# Patient Record
Sex: Female | Born: 1990 | Hispanic: Yes | Marital: Single | Smoking: Never smoker
Health system: Southern US, Community
[De-identification: ages and names within clinical notes are randomized; demographics above are authoritative.]

## PROBLEM LIST (undated history)

## (undated) DIAGNOSIS — Z789 Other specified health status: Secondary | ICD-10-CM

## (undated) HISTORY — PX: NO PAST SURGERIES: SHX2092

---

## 2013-04-29 ENCOUNTER — Emergency Department: Payer: Self-pay | Admitting: Emergency Medicine

## 2013-04-29 LAB — CBC WITH DIFFERENTIAL/PLATELET
Eosinophil #: 0 10*3/uL (ref 0.0–0.7)
HCT: 42.8 % (ref 35.0–47.0)
Lymphocyte #: 1.5 10*3/uL (ref 1.0–3.6)
Monocyte %: 3.1 %
Neutrophil %: 86.9 %
RDW: 12.7 % (ref 11.5–14.5)

## 2013-04-29 LAB — COMPREHENSIVE METABOLIC PANEL
Anion Gap: 4 — ABNORMAL LOW (ref 7–16)
Bilirubin,Total: 0.6 mg/dL (ref 0.2–1.0)
Calcium, Total: 8.9 mg/dL (ref 8.5–10.1)
Chloride: 104 mmol/L (ref 98–107)
EGFR (African American): >60
EGFR (Non-African Amer.): >60
Glucose: 100 mg/dL — ABNORMAL HIGH (ref 65–99)
Osmolality: 275 (ref 275–301)
Potassium: 3.8 mmol/L (ref 3.5–5.1)
SGOT(AST): 38 U/L — ABNORMAL HIGH (ref 15–37)
SGPT (ALT): 53 U/L (ref 12–78)
Sodium: 137 mmol/L (ref 136–145)

## 2013-04-29 LAB — URINALYSIS, COMPLETE
Bacteria: NONE SEEN
Bilirubin,UR: NEGATIVE
Blood: NEGATIVE
Glucose,UR: NEGATIVE mg/dL (ref 0–75)
Ph: 8 (ref 4.5–8.0)
RBC,UR: NONE SEEN /HPF (ref 0–5)
Specific Gravity: 1.023 (ref 1.003–1.030)
Squamous Epithelial: 9
WBC UR: 7 /HPF (ref 0–5)

## 2013-04-29 LAB — WET PREP, GENITAL

## 2013-04-30 LAB — GC/CHLAMYDIA PROBE AMP

## 2013-05-01 LAB — URINE CULTURE

## 2013-07-20 ENCOUNTER — Emergency Department: Payer: Self-pay | Admitting: Emergency Medicine

## 2013-07-20 LAB — CBC
HCT: 38.3 % (ref 35.0–47.0)
HGB: 13.2 g/dL (ref 12.0–16.0)
MCH: 30.9 pg (ref 26.0–34.0)
MCHC: 34.5 g/dL (ref 32.0–36.0)
MCV: 90 fL (ref 80–100)
Platelet: 158 10*3/uL (ref 150–440)
RBC: 4.27 10*6/uL (ref 3.80–5.20)
RDW: 13 % (ref 11.5–14.5)
WBC: 5.8 10*3/uL (ref 3.6–11.0)

## 2013-07-21 LAB — URINALYSIS, COMPLETE
BLOOD: NEGATIVE
Bilirubin,UR: NEGATIVE
Glucose,UR: NEGATIVE mg/dL (ref 0–75)
KETONE: NEGATIVE
Leukocyte Esterase: NEGATIVE
Nitrite: NEGATIVE
Ph: 6 (ref 4.5–8.0)
Protein: NEGATIVE
RBC,UR: <1 /HPF (ref 0–5)
Specific Gravity: 1.023 (ref 1.003–1.030)

## 2013-07-21 LAB — HCG, QUANTITATIVE, PREGNANCY: BETA HCG, QUANT.: 32709 m[IU]/mL — AB

## 2013-07-21 LAB — GC/CHLAMYDIA PROBE AMP

## 2013-07-21 LAB — WET PREP, GENITAL

## 2013-08-06 ENCOUNTER — Emergency Department: Payer: Self-pay | Admitting: Emergency Medicine

## 2013-08-06 LAB — HCG, QUANTITATIVE, PREGNANCY: BETA HCG, QUANT.: 3959 m[IU]/mL — AB

## 2013-08-07 LAB — CBC
HCT: 39.7 % (ref 35.0–47.0)
HGB: 13.5 g/dL (ref 12.0–16.0)
MCH: 30.7 pg (ref 26.0–34.0)
MCHC: 34.1 g/dL (ref 32.0–36.0)
MCV: 90 fL (ref 80–100)
PLATELETS: 189 10*3/uL (ref 150–440)
RBC: 4.41 10*6/uL (ref 3.80–5.20)
RDW: 12.7 % (ref 11.5–14.5)
WBC: 10.7 10*3/uL (ref 3.6–11.0)

## 2014-02-03 ENCOUNTER — Emergency Department: Payer: Self-pay | Admitting: Emergency Medicine

## 2014-02-04 LAB — URINALYSIS, COMPLETE
Bilirubin,UR: NEGATIVE
Blood: NEGATIVE
Glucose,UR: NEGATIVE mg/dL (ref 0–75)
KETONE: NEGATIVE
Leukocyte Esterase: NEGATIVE
Nitrite: NEGATIVE
Ph: 5 (ref 4.5–8.0)
Protein: NEGATIVE
RBC,UR: 1 /HPF (ref 0–5)
Specific Gravity: 1.023 (ref 1.003–1.030)
Squamous Epithelial: 1
WBC UR: 4 /HPF (ref 0–5)

## 2014-02-04 LAB — CBC
HCT: 38.7 % (ref 35.0–47.0)
HGB: 13.3 g/dL (ref 12.0–16.0)
MCH: 31.6 pg (ref 26.0–34.0)
MCHC: 34.5 g/dL (ref 32.0–36.0)
MCV: 92 fL (ref 80–100)
Platelet: 191 10*3/uL (ref 150–440)
RBC: 4.22 10*6/uL (ref 3.80–5.20)
RDW: 13.2 % (ref 11.5–14.5)
WBC: 9.4 10*3/uL (ref 3.6–11.0)

## 2014-02-04 LAB — BASIC METABOLIC PANEL
ANION GAP: 4 — AB (ref 7–16)
BUN: 12 mg/dL (ref 7–18)
Calcium, Total: 8.6 mg/dL (ref 8.5–10.1)
Chloride: 109 mmol/L — ABNORMAL HIGH (ref 98–107)
Co2: 24 mmol/L (ref 21–32)
Creatinine: 0.62 mg/dL (ref 0.60–1.30)
EGFR (Non-African Amer.): >60
Glucose: 80 mg/dL (ref 65–99)
Osmolality: 273 (ref 275–301)
Potassium: 3.2 mmol/L — ABNORMAL LOW (ref 3.5–5.1)
Sodium: 137 mmol/L (ref 136–145)

## 2014-02-04 LAB — TROPONIN I

## 2014-02-04 LAB — HCG, QUANTITATIVE, PREGNANCY: Beta Hcg, Quant.: 60113 m[IU]/mL — ABNORMAL HIGH

## 2014-04-29 ENCOUNTER — Observation Stay: Payer: Self-pay | Admitting: Obstetrics and Gynecology

## 2014-04-29 LAB — URINALYSIS, COMPLETE
BILIRUBIN, UR: NEGATIVE
Blood: NEGATIVE
Glucose,UR: NEGATIVE mg/dL (ref 0–75)
Ketone: NEGATIVE
Leukocyte Esterase: NEGATIVE
Nitrite: POSITIVE
Ph: 8 (ref 4.5–8.0)
Protein: NEGATIVE
SPECIFIC GRAVITY: 1.017 (ref 1.003–1.030)
Squamous Epithelial: 1

## 2014-04-30 LAB — WET PREP, GENITAL

## 2014-05-01 LAB — URINE CULTURE

## 2014-05-21 IMAGING — US US OB < 14 WEEKS - US OB TV
1 series · 14 of 28 positions shown · non-contrast
Comparison: 07/21/2013

CLINICAL DATA: Abdominal pain and vaginal bleeding. Ten weeks
pregnant.

EXAM:
OBSTETRIC <14 WK US AND TRANSVAGINAL OB US
TECHNIQUE: Both transabdominal and transvaginal ultrasound examinations were
performed for complete evaluation of the gestation as well as the
maternal uterus, adnexal regions, and pelvic cul-de-sac.
Transvaginal technique was performed to assess early pregnancy.

[Series 1: us ob < 14 weeks - us ob tv · 0.18mm/px · 14 of 40 slices shown]
[im 2/40]
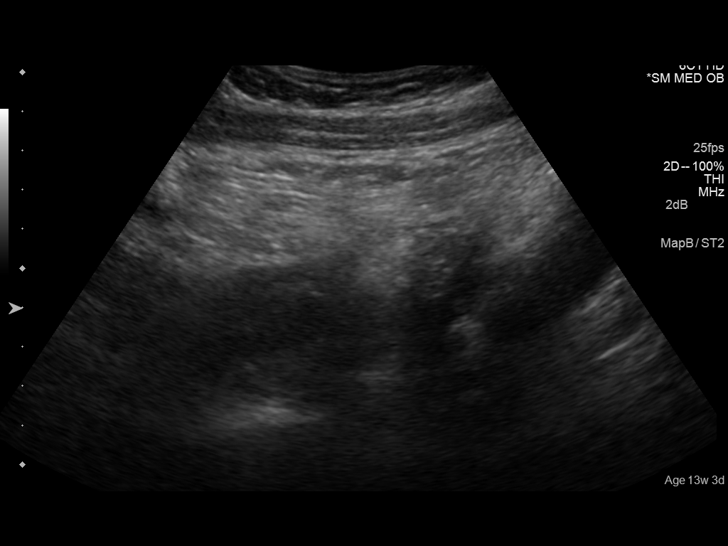
[im 5/40]
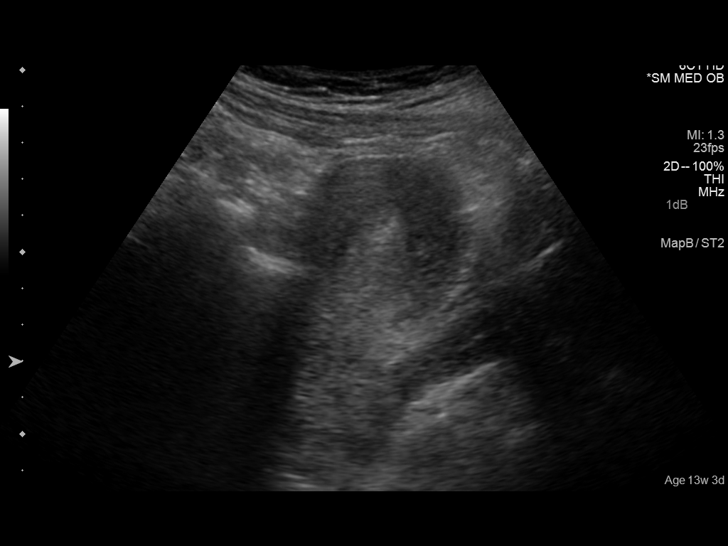
[im 8/40]
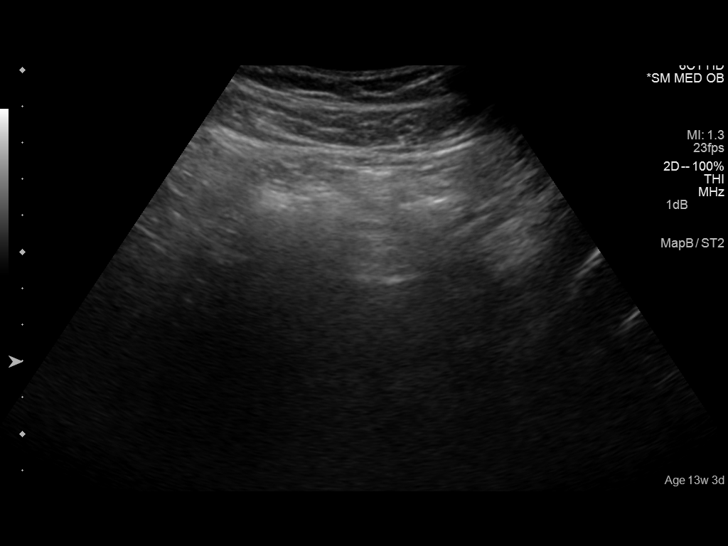
[im 11/40]
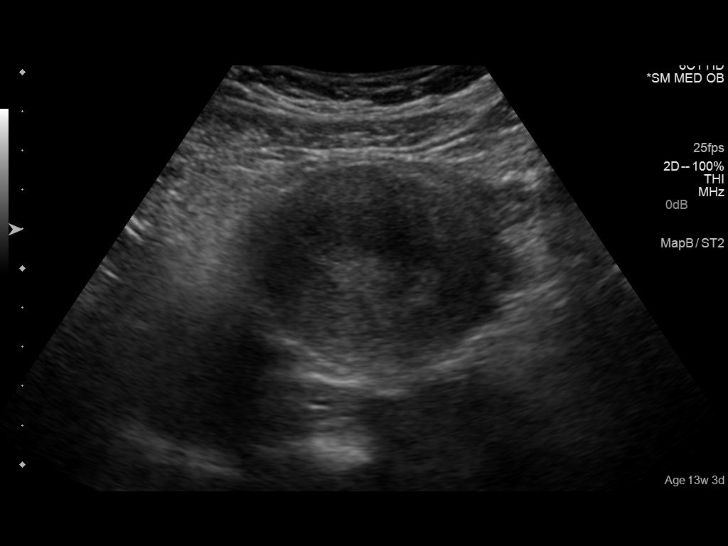
[im 14/40]
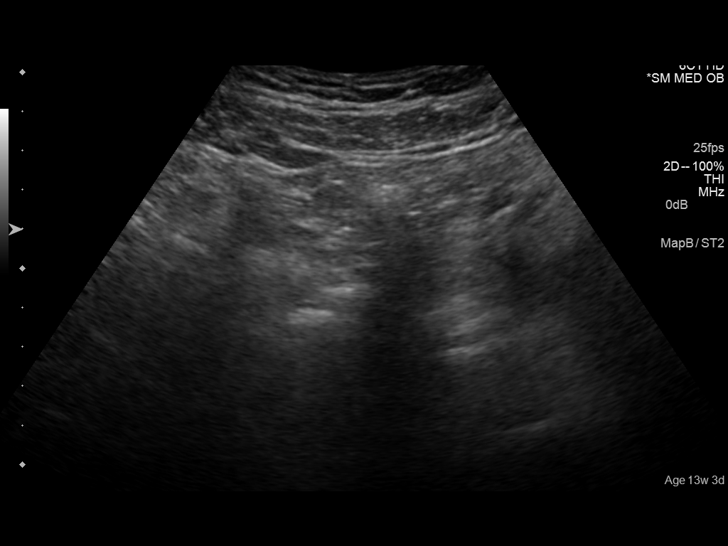
[im 16/40]
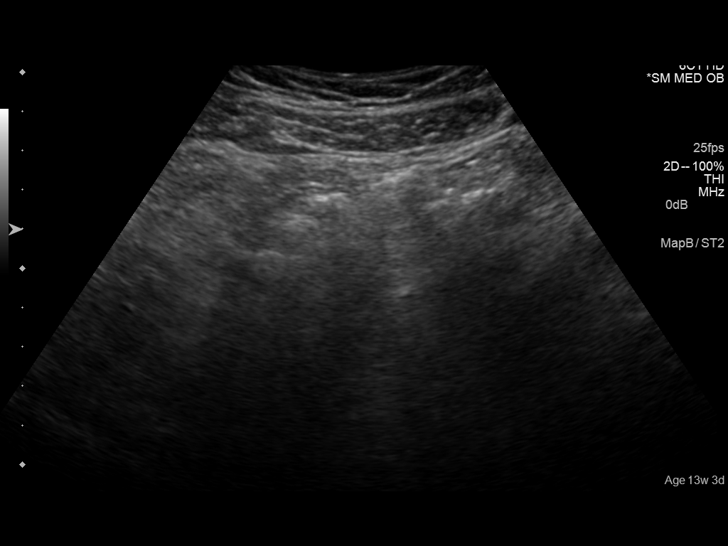
[im 19/40]
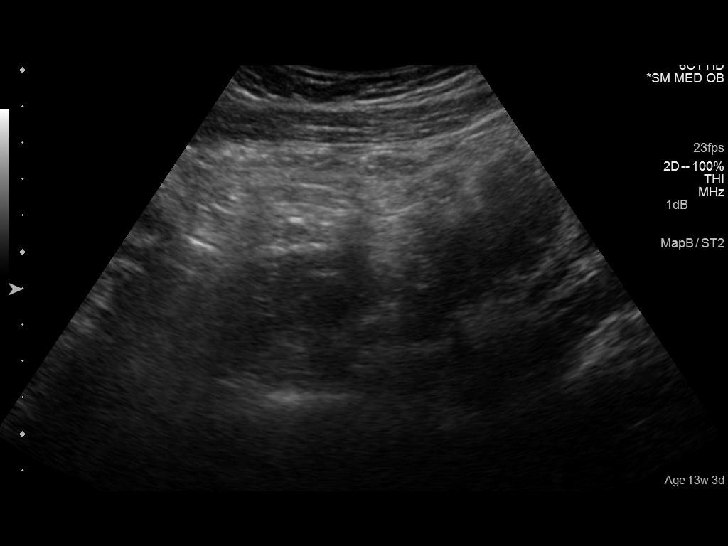
[im 22/40]
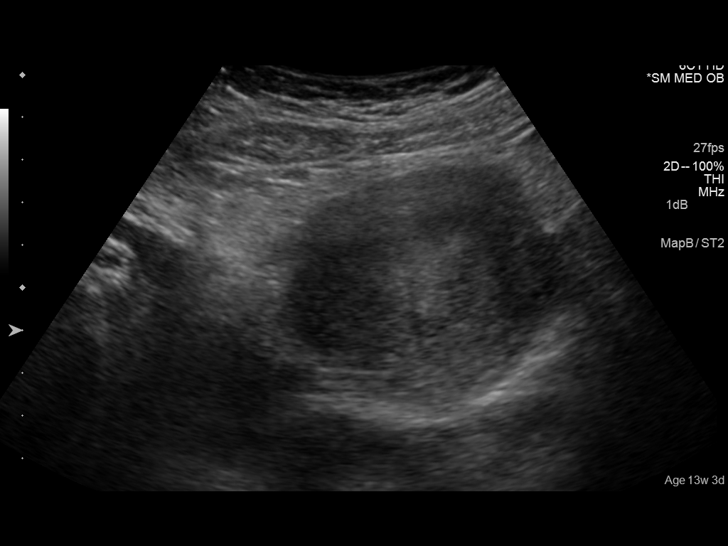
[im 25/40]
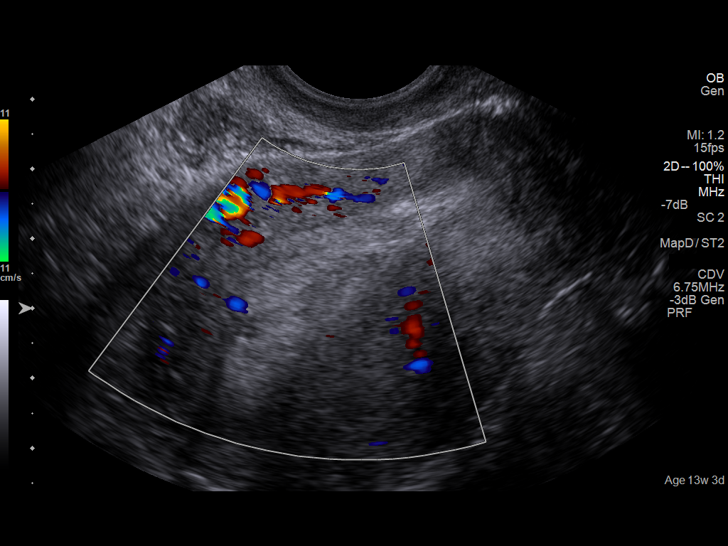
[im 28/40]
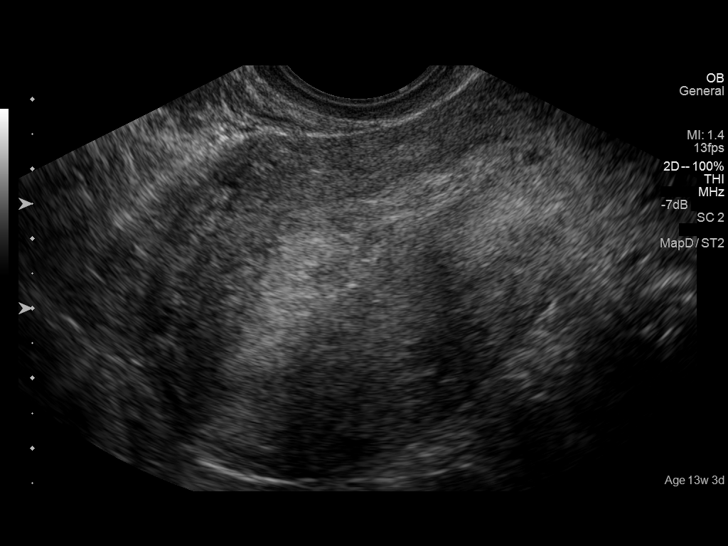
[im 31/40]
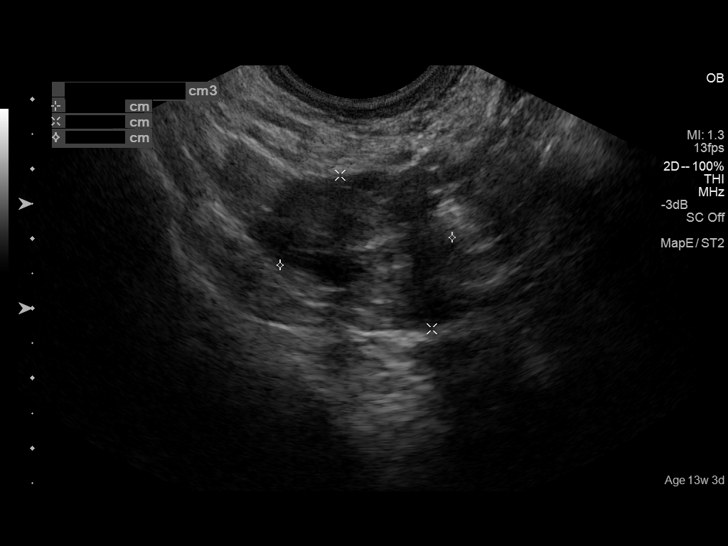
[im 34/40]
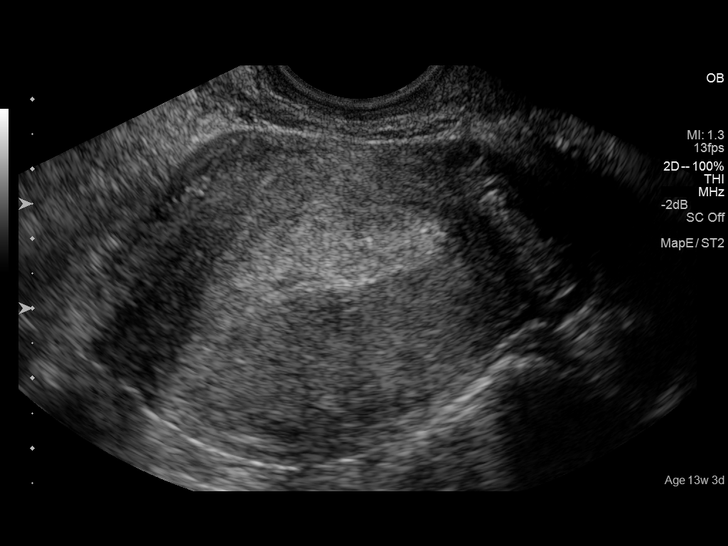
[im 37/40]
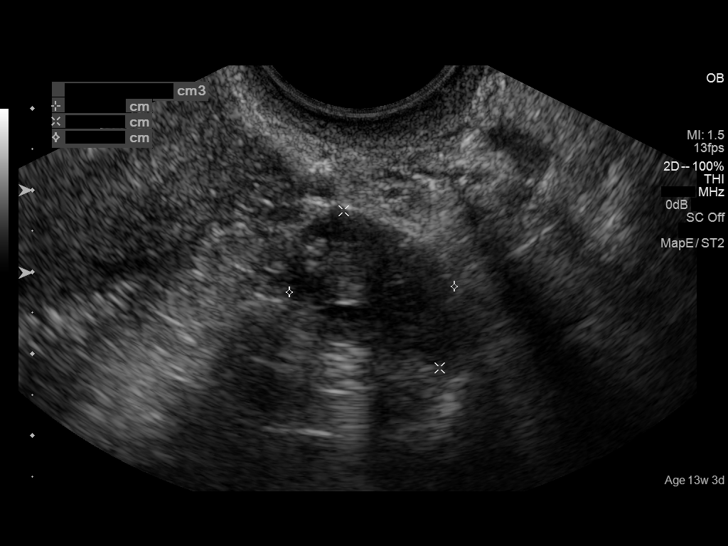
[im 40/40]
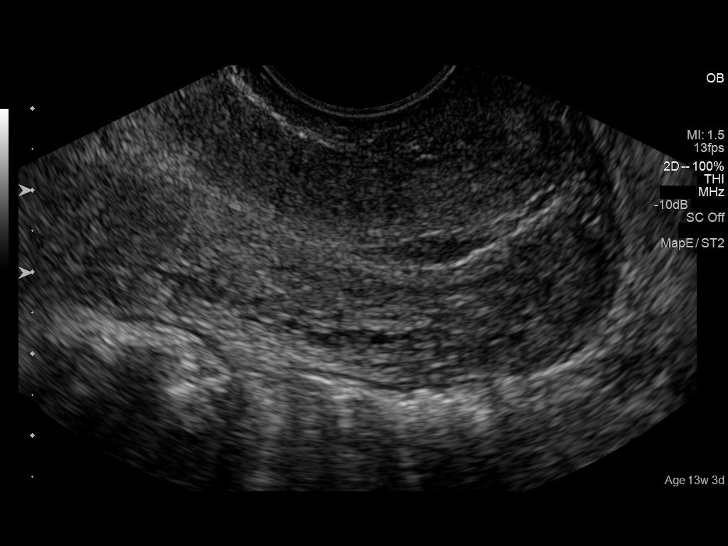

[14 of 28 positions shown; findings below may reference images not displayed]

FINDINGS: The previously seen intrauterine gestational sac has been evacuated
or collapsed. No normal intrauterine pregnancy visualized. In place
of the gestational sac, there is echogenic, mobile material within
the upper endometrial cavity, most consistent with blood products.
No definite vascularized tissue within this echogenic material. The
ovaries are symmetric in size and appearance. No free pelvic fluid.
No adnexal mass.
IMPRESSION: Spontaneous abortion, with the gestational sac noted 07/21/2013 no
longer evident. Moderate volume of blood present within the
endometrial cavity - cannot ensure complete clearance of gestational
products. Recommend serial beta HCG.

## 2014-07-27 ENCOUNTER — Inpatient Hospital Stay
Admit: 2014-07-27 | Disposition: A | Discharge status: Home / Self Care | Payer: Self-pay | Attending: Obstetrics and Gynecology | Admitting: Obstetrics and Gynecology

## 2014-07-27 LAB — CBC WITH DIFFERENTIAL/PLATELET
BASOS ABS: 0 10*3/uL (ref 0.0–0.1)
Basophil %: 0.3 %
EOS ABS: 0 10*3/uL (ref 0.0–0.7)
Eosinophil %: 0.3 %
HCT: 35.6 % (ref 35.0–47.0)
HGB: 11.4 g/dL — ABNORMAL LOW (ref 12.0–16.0)
Lymphocyte #: 2.3 10*3/uL (ref 1.0–3.6)
Lymphocyte %: 20.6 %
MCH: 26.2 pg (ref 26.0–34.0)
MCHC: 32.1 g/dL (ref 32.0–36.0)
MCV: 82 fL (ref 80–100)
MONOS PCT: 5.3 %
Monocyte #: 0.6 x10 3/mm (ref 0.2–0.9)
NEUTROS PCT: 73.5 %
Neutrophil #: 8.1 10*3/uL — ABNORMAL HIGH (ref 1.4–6.5)
Platelet: 206 10*3/uL (ref 150–440)
RBC: 4.37 10*6/uL (ref 3.80–5.20)
RDW: 16 % — AB (ref 11.5–14.5)
WBC: 11 10*3/uL (ref 3.6–11.0)

## 2014-07-28 LAB — HEMATOCRIT: HCT: 31.2 % — AB (ref 35.0–47.0)

## 2014-09-07 NOTE — H&P (Signed)
L&D Evaluation:  History:  HPI g4P1021 with LMP of 10/26/13 & [facility] here for labor starting at 0300am and becoming stronger and painful. PNC significant for   Presents with contractions   Patient's Medical History No Chronic Illness   Medications Pre Natal Vitamins   Allergies NKDA   Social History none   Family History Non-Contributory   ROS:  ROS All systems were reviewed.  HEENT, CNS, GI, GU, Respiratory, CV, Renal and Musculoskeletal systems were found to be normal.   Exam:  Vital Signs stable   General no apparent distress   Mental Status clear   Chest clear   Heart normal sinus rhythm, no murmur/gallop/rubs   Estimated Fetal Weight Average for gestational age   Fetal Position vtx   Reflexes 1+   Clonus negative   Pelvic 3/90/vtx-2   Mebranes Intact   Ucx regular, q 5 mins   Skin dry   Lymph no lymphadenopathy   Impression:  Impression active labor   Plan:  Comments Monitor UC/FHT's. GBS is neg. Will continue VS and pt plans for epidural for pain management.   Electronic Signatures: Sharee PimpleJones, Saysha Menta W (CNM)  (Signed 29-Mar-16 13:28)  Authored: L&D Evaluation   Last Updated: 29-Mar-16 13:28 by Sharee PimpleJones, Tieisha Darden W (CNM)

## 2016-04-30 NOTE — L&D Delivery Note (Signed)
Date of delivery: .11/22/16 Estimated Date of Delivery: 12/10/16 Patient's last menstrual period was 03/19/2016. EGA: 17101w3d  Delivery Note At 4:41 PM a viable female was delivered via Vaginal, Spontaneous Delivery (Presentation: cephalic; LOA).  APGAR: 8, 9; weight 6 lb 4.9 oz (2860 g).   Placenta status: spontaneous, membranes manually extracted.  Cord: 3vv, loos nuchal x1,  with the following complications: none apparent.  Cord pH: not collected  Anesthesia:  epidural Episiotomy: None Lacerations: None Suture Repair: n/a Est. Blood Loss (mL): 250cc  Mom presented to L&D with spontaneous labor.  epidual placed. Progressed to complete, second stage: <3210min, with delivery of fetal head with restitution to LOT.  Nuchal cord reduced at perineum.  Anterior then posterior shoulders delivered without difficulty.  Baby placed on mom's chest, and attended to by peds.  We sang happy birthday to baby [name].  Cord was then clamped and cut by FOB when pulseless.  Placenta spontaneously delivered, with membranes adherent to uterine wall - manually extracted.   IV pitocin given for hemorrhage prophylaxis.    Mom to postpartum.  Baby to Couplet care / Skin to Skin.  Chelsea C Ward 11/22/2016, 4:56 PM

## 2016-08-22 ENCOUNTER — Encounter: Payer: Self-pay | Admitting: *Deleted

## 2016-08-22 ENCOUNTER — Emergency Department
Admission: EM | Admit: 2016-08-22 | Discharge: 2016-08-22 | Disposition: A | Discharge status: Home / Self Care | Payer: Self-pay | Attending: Emergency Medicine | Admitting: Emergency Medicine

## 2016-08-22 DIAGNOSIS — K224 Dyskinesia of esophagus: Secondary | ICD-10-CM | POA: Insufficient documentation

## 2016-08-22 LAB — POCT RAPID STREP A: STREPTOCOCCUS, GROUP A SCREEN (DIRECT): NEGATIVE

## 2016-08-22 MED ORDER — FAMOTIDINE 20 MG PO TABS
40.0000 mg | ORAL_TABLET | Freq: Once | ORAL | Status: AC
  Administered 2016-08-22: 40 mg via ORAL
  Filled 2016-08-22: qty 2

## 2016-08-22 MED ORDER — METOCLOPRAMIDE HCL 5 MG PO TABS: 5.0000 mg | ORAL_TABLET | Freq: Three times a day (TID) | ORAL | 0 refills | Status: DC | PRN

## 2016-08-22 MED ORDER — METOCLOPRAMIDE HCL 10 MG PO TABS
20.0000 mg | ORAL_TABLET | Freq: Once | ORAL | Status: AC
  Administered 2016-08-22: 20 mg via ORAL
  Filled 2016-08-22: qty 2

## 2016-08-22 NOTE — ED Notes (Signed)
Not in room for discharge instructions   

## 2016-08-22 NOTE — ED Notes (Signed)
See triage note  States she felt like something is stuck in throat since last pm became worse this am  Feels like she can't swallow food or water  No drooling noted

## 2016-08-22 NOTE — ED Triage Notes (Signed)
Pt complains of a sore throat, pt denies any other symptoms, pt is [redacted] weeks pregnant, denies any problems with pregnancy

## 2016-08-22 NOTE — Discharge Instructions (Signed)
Your symptoms are consistent with spasms of the swallowing tube (esophagus). Take the prescription medicine as directed. Follow-up with [facility] for ongoing symptoms. Eat soft foods and drink plenty of fluids to prevent dehydration. Return to the ED for vomiting, gagging, choking, or chest pains.

## 2016-08-25 LAB — OB RESULTS CONSOLE GBS: STREP GROUP B AG: NEGATIVE

## 2016-08-30 NOTE — ED Provider Notes (Signed)
[facility] Emergency Department Provider Note ____________________________________________  Time seen: 1220  I have reviewed the triage vital signs and the nursing notes.  HISTORY  Chief Complaint  Sore Throat  HPI Carrie Wagner is a 26 y.o. female presents to the ED at [redacted] weeks gestation, for evaluation of a sore throat. Upon further questioning, she describes a globus sensation to the throat. She reports avoiding food and drink due to the sense that she has pressure in her throat. She denies cough, choking, gagging, or vomiting. She also denies any swelling to her mouth, lips, or tongue. She denies any recent illness, fevers, or history of reflux except with her last pregnancy.   History reviewed. No pertinent past medical history.  There are no active problems to display for this patient.  History reviewed. No pertinent surgical history.  Prior to Admission medications   Medication Sig Start Date End Date Taking? Authorizing Provider  metoCLOPramide (REGLAN) 5 MG tablet Take 1 tablet (5 mg total) by mouth every 8 (eight) hours as needed for nausea or vomiting. 08/22/16   Charlesetta Ivory Jaquan Sadowsky, PA-C    Allergies Patient has no known allergies.  No family history on file.  Social History Social History  Substance Use Topics  . Smoking status: Never Smoker  . Smokeless tobacco: Not on file  . Alcohol use No    Review of Systems  Constitutional: Negative for fever. Eyes: Negative for visual changes. ENT: Negative for sore throat. Globus sensation as above Cardiovascular: Negative for chest pain. Respiratory: Negative for shortness of breath. Gastrointestinal: Negative for abdominal pain, vomiting and diarrhea. ____________________________________________  PHYSICAL EXAM:  VITAL SIGNS: ED Triage Vitals  Enc Vitals Group     BP 08/22/16 1140 (!) 102/56     Pulse Rate 08/22/16 1140 81     Resp 08/22/16 1140 14     Temp 08/22/16  1140 99 F (37.2 C)     Temp Source 08/22/16 1140 Oral     SpO2 08/22/16 1140 99 %     Weight 08/22/16 1129 156 lb (70.8 kg)     Height 08/22/16 1129 5\' 4"  (1.626 m)     Head Circumference --      Peak Flow --      Pain Score --      Pain Loc --      Pain Edu? --      Excl. in GC? --     Constitutional: Alert and oriented. Well appearing and in no distress. Head: Normocephalic and atraumatic. Eyes: Conjunctivae are normal. PERRL. Normal extraocular movements Mouth/Throat: Mucous membranes are moist. Uvula is midline and tonsils are flat. No oropharyngeal lesions noted. No saliva pooling or sublingual swelling. No dental abscess appreciated.  Neck: Supple. No thyromegaly. Hematological/Lymphatic/Immunological: No cervical lymphadenopathy. Cardiovascular: Normal rate, regular rhythm. Normal distal pulses. Respiratory: Normal respiratory effort. No wheezes/rales/rhonchi. Gastrointestinal: Soft and nontender. No distention. Musculoskeletal: Nontender with normal range of motion in all extremities.  Neurologic:  Normal gait without ataxia. Normal speech and language. No gross focal neurologic deficits are appreciated. Skin:  Skin is warm, dry and intact. No rash noted. Psychiatric: Mood and affect are normal. Patient exhibits appropriate insight and judgment. ____________________________________________   LABS (pertinent positives/negatives) Labs Reviewed  POCT RAPID STREP A  ____________________________________________  PROCEDURES  Famotidine 40 mg PO Metoclopramide 20 mg PO ______________________________________________  INITIAL IMPRESSION / ASSESSMENT AND PLAN / ED COURSE  Pregnant female patient with an ED evaluation for globus sensation to  the throat. The patient is with an otherwise benign exam and a negative rapid strep test. She will be treated at this time with a prescription for metoclopramide, for smooth Muscle spasm and esophageal spasm. She is encouraged to follow  with her OB provider for ongoing symptom management. She may infected evaluated by a gastroenterologist, in the postpartum setting. Return precautions were reviewed for difficulty swallowing, difficulty breathing, or intractable pain. ____________________________________________  FINAL CLINICAL IMPRESSION(S) / ED DIAGNOSES  Final diagnoses:  Esophagospasm      Lissa HoardJenise V Bacon Ambrosia Wisnewski, PA-C 08/30/16 1828    Emily FilbertWilliams, Jonathan E, MD 09/03/16 [phone]

## 2016-10-23 LAB — OB RESULTS CONSOLE RUBELLA ANTIBODY, IGM: Rubella: IMMUNE

## 2016-10-23 LAB — OB RESULTS CONSOLE GC/CHLAMYDIA
Chlamydia: NEGATIVE
GC PROBE AMP, GENITAL: NEGATIVE

## 2016-10-23 LAB — OB RESULTS CONSOLE HIV ANTIBODY (ROUTINE TESTING): HIV: NONREACTIVE

## 2016-10-23 LAB — OB RESULTS CONSOLE RPR: RPR: NONREACTIVE

## 2016-10-23 LAB — OB RESULTS CONSOLE VARICELLA ZOSTER ANTIBODY, IGG: Varicella: NON-IMMUNE/NOT IMMUNE

## 2016-10-23 LAB — OB RESULTS CONSOLE HEPATITIS B SURFACE ANTIGEN: Hepatitis B Surface Ag: NEGATIVE

## 2016-11-22 ENCOUNTER — Inpatient Hospital Stay
Admission: EM | Admit: 2016-11-22 | Discharge: 2016-11-24 | DRG: 775 | Disposition: A | Discharge status: Home / Self Care | Payer: Self-pay | Attending: Obstetrics & Gynecology | Admitting: Obstetrics & Gynecology

## 2016-11-22 ENCOUNTER — Inpatient Hospital Stay: Payer: Self-pay | Admitting: Registered Nurse

## 2016-11-22 DIAGNOSIS — Z3A37 37 weeks gestation of pregnancy: Secondary | ICD-10-CM

## 2016-11-22 HISTORY — DX: Other specified health status: Z78.9

## 2016-11-22 LAB — CBC
HCT: 35.4 % (ref 35.0–47.0)
HEMOGLOBIN: 11.7 g/dL — AB (ref 12.0–16.0)
MCH: 27 pg (ref 26.0–34.0)
MCHC: 33 g/dL (ref 32.0–36.0)
MCV: 81.7 fL (ref 80.0–100.0)
Platelets: 195 10*3/uL (ref 150–440)
RBC: 4.33 MIL/uL (ref 3.80–5.20)
RDW: 15 % — ABNORMAL HIGH (ref 11.5–14.5)
WBC: 11.5 10*3/uL — AB (ref 3.6–11.0)

## 2016-11-22 LAB — TYPE AND SCREEN
ABO/RH(D): O POS
Antibody Screen: NEGATIVE

## 2016-11-22 MED ORDER — ONDANSETRON HCL 4 MG PO TABS: 4.0000 mg | ORAL_TABLET | ORAL | Status: DC | PRN

## 2016-11-22 MED ORDER — LIDOCAINE HCL (PF) 1 % IJ SOLN
30.0000 mL | INTRAMUSCULAR | Status: DC | PRN
  Filled 2016-11-22: qty 30

## 2016-11-22 MED ORDER — IBUPROFEN 600 MG PO TABS
600.0000 mg | ORAL_TABLET | Freq: Four times a day (QID) | ORAL | Status: DC
  Administered 2016-11-22 – 2016-11-24 (×6): 600 mg via ORAL
  Filled 2016-11-22 (×6): qty 1

## 2016-11-22 MED ORDER — LACTATED RINGERS IV SOLN
500.0000 mL | INTRAVENOUS | Status: DC | PRN
  Administered 2016-11-22: 500 mL via INTRAVENOUS

## 2016-11-22 MED ORDER — ACETAMINOPHEN 500 MG PO TABS: 1000.0000 mg | ORAL_TABLET | Freq: Four times a day (QID) | ORAL | Status: DC | PRN

## 2016-11-22 MED ORDER — SOD CITRATE-CITRIC ACID 500-334 MG/5ML PO SOLN: 30.0000 mL | ORAL | Status: DC | PRN

## 2016-11-22 MED ORDER — COCONUT OIL OIL: 1.0000 "application " | TOPICAL_OIL | Status: DC | PRN

## 2016-11-22 MED ORDER — WITCH HAZEL-GLYCERIN EX PADS: 1.0000 "application " | MEDICATED_PAD | CUTANEOUS | Status: DC | PRN

## 2016-11-22 MED ORDER — PHENYLEPHRINE 40 MCG/ML (10ML) SYRINGE FOR IV PUSH (FOR BLOOD PRESSURE SUPPORT)
PREFILLED_SYRINGE | INTRAVENOUS | Status: AC
  Filled 2016-11-22: qty 10

## 2016-11-22 MED ORDER — DIPHENHYDRAMINE HCL 25 MG PO CAPS: 25.0000 mg | ORAL_CAPSULE | Freq: Four times a day (QID) | ORAL | Status: DC | PRN

## 2016-11-22 MED ORDER — SODIUM CHLORIDE 0.9 % IV SOLN
2.0000 g | Freq: Once | INTRAVENOUS | Status: AC
  Administered 2016-11-22: 2 g via INTRAVENOUS
  Filled 2016-11-22: qty 2000

## 2016-11-22 MED ORDER — BENZOCAINE-MENTHOL 20-0.5 % EX AERO: 1.0000 "application " | INHALATION_SPRAY | CUTANEOUS | Status: DC | PRN

## 2016-11-22 MED ORDER — BUPIVACAINE HCL (PF) 0.25 % IJ SOLN
INTRAMUSCULAR | Status: DC | PRN
  Administered 2016-11-22 (×2): 4 mL via EPIDURAL

## 2016-11-22 MED ORDER — OXYTOCIN 40 UNITS IN LACTATED RINGERS INFUSION - SIMPLE MED
2.5000 [IU]/h | INTRAVENOUS | Status: DC
  Filled 2016-11-22: qty 1000

## 2016-11-22 MED ORDER — DIBUCAINE 1 % RE OINT: 1.0000 "application " | TOPICAL_OINTMENT | RECTAL | Status: DC | PRN

## 2016-11-22 MED ORDER — SIMETHICONE 80 MG PO CHEW: 80.0000 mg | CHEWABLE_TABLET | ORAL | Status: DC | PRN

## 2016-11-22 MED ORDER — FENTANYL 2.5 MCG/ML W/ROPIVACAINE 0.15% IN NS 100 ML EPIDURAL (ARMC)
EPIDURAL | Status: AC
  Filled 2016-11-22: qty 100

## 2016-11-22 MED ORDER — FENTANYL 2.5 MCG/ML W/ROPIVACAINE 0.15% IN NS 100 ML EPIDURAL (ARMC)
EPIDURAL | Status: DC | PRN
  Administered 2016-11-22: 12 mL/h via EPIDURAL

## 2016-11-22 MED ORDER — OXYTOCIN BOLUS FROM INFUSION
500.0000 mL | Freq: Once | INTRAVENOUS | Status: AC
  Administered 2016-11-22: 500 mL via INTRAVENOUS

## 2016-11-22 MED ORDER — ACETAMINOPHEN 325 MG PO TABS: 650.0000 mg | ORAL_TABLET | ORAL | Status: DC | PRN

## 2016-11-22 MED ORDER — LIDOCAINE-EPINEPHRINE (PF) 1.5 %-1:200000 IJ SOLN
INTRAMUSCULAR | Status: DC | PRN
  Administered 2016-11-22: 3 mL via PERINEURAL

## 2016-11-22 MED ORDER — ONDANSETRON HCL 4 MG/2ML IJ SOLN: 4.0000 mg | INTRAMUSCULAR | Status: DC | PRN

## 2016-11-22 MED ORDER — AMMONIA AROMATIC IN INHA
RESPIRATORY_TRACT | Status: AC
  Filled 2016-11-22: qty 10

## 2016-11-22 MED ORDER — DOCUSATE SODIUM 100 MG PO CAPS
100.0000 mg | ORAL_CAPSULE | Freq: Two times a day (BID) | ORAL | Status: DC
  Administered 2016-11-23 (×3): 100 mg via ORAL
  Filled 2016-11-22 (×3): qty 1

## 2016-11-22 MED ORDER — ONDANSETRON HCL 4 MG/2ML IJ SOLN: 4.0000 mg | Freq: Four times a day (QID) | INTRAMUSCULAR | Status: DC | PRN

## 2016-11-22 MED ORDER — MISOPROSTOL 200 MCG PO TABS
ORAL_TABLET | ORAL | Status: AC
  Filled 2016-11-22: qty 4

## 2016-11-22 MED ORDER — LACTATED RINGERS IV SOLN
INTRAVENOUS | Status: DC
  Administered 2016-11-22: 13:00:00 via INTRAVENOUS

## 2016-11-22 MED ORDER — PRENATAL MULTIVITAMIN CH
1.0000 | ORAL_TABLET | Freq: Every day | ORAL | Status: DC
Start: 2016-11-23 — End: 2016-11-24
  Administered 2016-11-23: 1 via ORAL
  Filled 2016-11-22: qty 1

## 2016-11-22 MED ORDER — LIDOCAINE HCL (PF) 1 % IJ SOLN
INTRAMUSCULAR | Status: DC | PRN
  Administered 2016-11-22: 3 mL

## 2016-11-22 NOTE — Progress Notes (Signed)
Intrapartum progress note  S/p epidural, comfortable. No complaints  BP 103/71   Pulse 61   Temp 98.6 F (37 C) (Oral)   Resp 18   Ht 5\' 4"  (1.626 m)   Wt 73.9 kg (163 lb)   LMP 03/25/2016 (LMP Unknown)   SpO2 99%   BMI 27.98 kg/m   FHT: 135 mod + accels no decels TOCO q762min SVE: 9/100/0, AROM for clear fluid  AP: 25yo [id number] @ 37.3 with labor 1. IUP: category 1 2. Expectant management after AROM  ----- Carrie Plumberhelsea Derrius Furtick, MD Attending Obstetrician and Gynecologist [facility], Department of OB/GYN [facility]

## 2016-11-22 NOTE — OB Triage Note (Signed)
Pt  Presents form ED c/o ctx every 7-8 min with pain 8/10. Pt denies LOF, VB and states +FM.

## 2016-11-22 NOTE — Discharge Summary (Signed)
Obstetrical Discharge Summary  Patient Name: Carrie FerrierMaria Morales Wagner DOB: [birth year] MRN: [id number]  Date of Admission: 11/22/2016 Date of Delivery: 11/22/16 Delivered by: Ranae Plumberhelsea Ward, MD Date of Discharge: 11/22/2016  Primary OB: [facility]  JYN:[id number]:Patient's last menstrual period was 03/19/2016. EDC Estimated Date of Delivery: 12/10/16 Gestational Age at Delivery: 60105w3d   Antepartum complications: Insufficient prenatal care  Admitting Diagnosis: labor Secondary Diagnosis: Patient Active Problem List   Diagnosis Date Noted  . Labor and delivery, indication for care 11/22/2016    Augmentation: AROM Complications: None Intrapartum complications/course: Mom presented to L&D with spontaneous labor.  epidual placed. Progressed to complete, second stage: <4810min, with delivery of fetal head with restitution to LOT.  Nuchal cord reduced at perineum.  Anterior then posterior shoulders delivered without difficulty.  Baby placed on mom's chest, and attended to by peds.  We sang happy birthday to baby [name].  Cord was then clamped and cut by FOB when pulseless.  Placenta spontaneously delivered, with membranes adherent to uterine wall - manually extracted.   IV pitocin given for hemorrhage prophylaxis. Date of Delivery: 11/22/16 Delivered By: Leeroy Bockhelsea Ward Delivery Type: spontaneous vaginal delivery Anesthesia: epidural Placenta: sponatneous Laceration: none Episiotomy: none Newborn Data: Live born female, [name] Birth Weight: 6 lb 4.9 oz (2860 g) APGAR: 8, 9  Postpartum Procedures: None  Post partum course: sTable  Patient had an uncomplicated postpartum course.  By time of discharge on PPD#1 her pain was controlled on oral pain medications; she had appropriate lochia and was ambulating, voiding without difficulty and tolerating regular diet.  She was deemed stable for discharge to home.     Discharge Physical Exam:  BP 103/71   Pulse 61   Temp 98.6 F (37 C) (Oral)   Resp 18   Ht  5\' 4"  (1.626 m)   Wt 73.9 kg (163 lb)   LMP 03/19/2016   SpO2 99%   BMI 27.98 kg/m   General: NAD CV: RRR Pulm: CTABL, nl effort ABD: s/nd/nt, fundus firm and below the umbilicus Lochia: moderate DVT Evaluation: LE non-ttp, no evidence of DVT on exam.  Hemoglobin  Date Value Ref Range Status  11/22/2016 11.7 (L) 12.0 - 16.0 g/dL Final   HGB  Date Value Ref Range Status  07/27/2014 11.4 (L) 12.0 - 16.0 g/dL Final   HCT  Date Value Ref Range Status  11/22/2016 35.4 35.0 - 47.0 % Final  07/28/2014 31.2 (L) 35.0 - 47.0 % Final     Disposition: stable, discharge to home. Baby Feeding: breastmilk/formula Baby Disposition: home with mom  Rh Immune globulin given: n/a Rubella vaccine given: n/a Tdap vaccine given in AP or PP setting: AP Flu vaccine given in AP or PP setting: n/a  Contraception: plans nexplanon  Prenatal Labs:  Antibody screen neg  Rubella Immune  Varicella Immune - had dz  RPR NR  HBsAg Neg  HIV NR  GC neg  Chlamydia neg  Genetic screening Not done  1 hour GTT 139  3 hour GTT   GBS Neg 12 weeks ago     Plan:  Carrie FerrierMaria Morales Wagner was discharged to home in good condition. Follow-up appointment with delivering provider in 6 weeks.  Discharge Medications: PNV, Ibuprofen, FE    Signed: Sharee Pimplearon W. Benito Lemmerman, RN, MSN, CNM, FNP

## 2016-11-22 NOTE — Anesthesia Procedure Notes (Signed)
Epidural Patient location during procedure: OB Start time: 11/22/2016 1:56 PM End time: 11/22/2016 1:58 PM  Staffing Anesthesiologist: Yevette EdwardsADAMS, JAMES G Resident/CRNA: Malva CoganBEANE, CATHERINE  Preanesthetic Checklist Completed: patient identified, site marked, surgical consent, pre-op evaluation, timeout performed, IV checked, risks and benefits discussed and monitors and equipment checked  Epidural Patient position: sitting Prep: Betadine Patient monitoring: heart rate, continuous pulse ox and blood pressure Approach: midline Location: L4-L5 Injection technique: LOR saline  Needle:  Needle type: Tuohy  Needle gauge: 17 G Needle length: 9 cm and 9 Needle insertion depth: 4 cm Catheter type: closed end flexible Catheter size: 19 Gauge Catheter at skin depth: 9 cm Test dose: negative and 1.5% lidocaine with Epi 1:200 K  Assessment Sensory level: T10 Events: blood not aspirated, injection not painful, no injection resistance, negative IV test and no paresthesia  Additional Notes Pt. Evaluated and documentation done after procedure finished. Patient identified. Risks/Benefits/Options discussed with patient including but not limited to bleeding, infection, nerve damage, paralysis, failed block, incomplete pain control, headache, blood pressure changes, nausea, vomiting, reactions to medication both or allergic, itching and postpartum back pain. Confirmed with bedside nurse the patient's most recent platelet count. Confirmed with patient that they are not currently taking any anticoagulation, have any bleeding history or any family history of bleeding disorders. Patient expressed understanding and wished to proceed. All questions were answered. Sterile technique was used throughout the entire procedure. Please see nursing notes for vital signs. Test dose was given through epidural catheter and negative prior to continuing to dose epidural or start infusion. Warning signs of high block given to the  patient including shortness of breath, tingling/numbness in hands, complete motor block, or any concerning symptoms with instructions to call for help. Patient was given instructions on fall risk and not to get out of bed. All questions and concerns addressed with instructions to call with any issues or inadequate analgesia.   Patient tolerated the insertion well without immediate complications.Reason for block:procedure for pain

## 2016-11-22 NOTE — H&P (Signed)
OB History & Physical   History of Present Illness:  Chief Complaint: ctx HPI:  Carrie Wagner is a 26 y.o. [phone] female at 344w3d dated by 32wk US with Estimated Date of Delivery: 12/24/16 LMP reported as 03/25/16 (approx) She presents to L&D with contractions that started last night  +FM, + CTX, no LOF, no VB  Pregnancy Issues: 1. scant prenatal care 2. Unsure dating  Maternal Medical History:   Past Medical History:  Diagnosis Date  . Medical history non-contributory     Past Surgical History:  Procedure Laterality Date  . NO PAST SURGERIES      No Known Allergies  Prior to Admission medications   Medication Sig Start Date End Date Taking? Authorizing Provider  Prenatal Vit-Fe Fumarate-FA (MULTIVITAMIN-PRENATAL) 27-0.8 MG TABS tablet Take 1 tablet by mouth daily at 12 noon.   Yes [provider]  metoCLOPramide (REGLAN) 5 MG tablet Take 1 tablet (5 mg total) by mouth every 8 (eight) hours as needed for nausea or vomiting. Patient not taking: Reported on 11/22/2016 08/22/16   Menshew, Charlesetta IvoryJenise V Bacon, PA-C     [facility]  Social History: She  reports that she has never smoked. She has never used smokeless tobacco. She reports that she does not drink alcohol or use drugs.  Family History: family history is not on file.   Review of Systems: A full review of systems was performed and negative except as noted in the HPI.     Physical Exam:  Vital Signs: BP 109/71 (BP Location: Right Arm)   Pulse 71   Temp 98.6 F (37 C) (Oral)   Resp 18   Ht 5\' 4"  (1.626 m)   Wt 73.9 kg (163 lb)   LMP 03/25/2016 (LMP Unknown)   BMI 27.98 kg/m  General: no acute distress.  HEENT: normocephalic, atraumatic Heart: regular rate & rhythm.  No murmurs/rubs/gallops Lungs: clear to auscultation bilaterally, normal respiratory effort Abdomen: soft, gravid, non-tender;  EFW: 6.6 Pelvic:   External: Normal external female genitalia  Cervix:  Dilation: 6 / Effacement (%): 90 / Station: -1    Extremities: non-tender, symmetric, mild edema bilaterally.  DTRs: 2+  Neurologic: Alert & oriented x 3.    Results for orders placed or performed during the hospital encounter of 11/22/16 (from the past 24 hour(s))  CBC     Status: Abnormal   Collection Time: 11/22/16 12:32 PM  Result Value Ref Range   WBC 11.5 (H) 3.6 - 11.0 K/uL   RBC 4.33 3.80 - 5.20 MIL/uL   Hemoglobin 11.7 (L) 12.0 - 16.0 g/dL   HCT 69.635.4 29.535.0 - 28.447.0 %   MCV 81.7 80.0 - 100.0 fL   MCH 27.0 26.0 - 34.0 pg   MCHC 33.0 32.0 - 36.0 g/dL   RDW 13.215.0 (H) 44.011.5 - 10.214.5 %   Platelets 195 150 - 440 K/uL  Type and screen [facility]     Status: None (Preliminary result)   Collection Time: 11/22/16 12:32 PM  Result Value Ref Range   ABO/RH(D) PENDING    Antibody Screen PENDING    Sample Expiration 11/25/2016     Pertinent Results:  Prenatal Labs: Blood type/Rh O+  Antibody screen neg  Rubella Immune  Varicella Immune - had dz  RPR NR  HBsAg Neg  HIV NR  GC neg  Chlamydia neg  Genetic screening Not done  1 hour GTT 139  3 hour GTT   GBS Neg 12  weeks ago  tdap given   FHT: 145 mod +accels no decels TOCO: q4-155min SVE:  Dilation: 6 / Effacement (%): 90 / Station: -1    Cephalic by leopolds   Assessment:  Carrie Wagner is a 26 y.o. [phone] female at 2920w3d with labor.   Plan:  1. Admit to Labor & Delivery 2. CBC, T&S, Clrs, IVF 3. GBS  Unknown, no risk factors. 4. Consents obtained. 5. Continuous efm/toco 6. Expectant management 7. Epidural if desired.  ----- Ranae Plumberhelsea Ward, MD Attending Obstetrician and Gynecologist [facility], Department of OB/GYN [facility]

## 2016-11-22 NOTE — Anesthesia Preprocedure Evaluation (Signed)
Anesthesia Evaluation  Patient identified by MRN, date of birth, ID band Patient awake    Reviewed: Allergy & Precautions, H&P , NPO status , Patient's Chart, lab work & pertinent test results  History of Anesthesia Complications Negative for: history of anesthetic complications  Airway Mallampati: II  TM Distance: >3 FB Neck ROM: full    Dental no notable dental hx.    Pulmonary neg pulmonary ROS,    Pulmonary exam normal        Cardiovascular negative cardio ROS Normal cardiovascular exam     Neuro/Psych negative neurological ROS  negative psych ROS   GI/Hepatic Neg liver ROS,   Endo/Other  negative endocrine ROS  Renal/GU negative Renal ROS  negative genitourinary   Musculoskeletal   Abdominal   Peds  Hematology negative hematology ROS (+)   Anesthesia Other Findings   Reproductive/Obstetrics (+) Pregnancy                             Anesthesia Physical Anesthesia Plan  ASA: II  Anesthesia Plan: Epidural   Post-op Pain Management:    Induction:   PONV Risk Score and Plan:   Airway Management Planned:   Additional Equipment:   Intra-op Plan:   Post-operative Plan:   Informed Consent: I have reviewed the patients History and Physical, chart, labs and discussed the procedure including the risks, benefits and alternatives for the proposed anesthesia with the patient or authorized representative who has indicated his/her understanding and acceptance.     Plan Discussed with: Anesthesiologist  Anesthesia Plan Comments:         Anesthesia Quick Evaluation

## 2016-11-22 NOTE — Discharge Instructions (Signed)

## 2016-11-22 NOTE — OB Triage Note (Signed)
Pt states ctx started last night

## 2016-11-23 LAB — CBC
HCT: 32.9 % — ABNORMAL LOW (ref 35.0–47.0)
HEMOGLOBIN: 11 g/dL — AB (ref 12.0–16.0)
MCH: 26.9 pg (ref 26.0–34.0)
MCHC: 33.4 g/dL (ref 32.0–36.0)
MCV: 80.5 fL (ref 80.0–100.0)
Platelets: 167 10*3/uL (ref 150–440)
RBC: 4.08 MIL/uL (ref 3.80–5.20)
RDW: 15.5 % — ABNORMAL HIGH (ref 11.5–14.5)
WBC: 13.5 10*3/uL — AB (ref 3.6–11.0)

## 2016-11-23 LAB — RPR: RPR Ser Ql: NONREACTIVE

## 2016-11-23 NOTE — Anesthesia Postprocedure Evaluation (Signed)
Anesthesia Post Note  Patient: [name]  Procedure(s) Performed: * No procedures listed *  Patient location during evaluation: Mother Baby Anesthesia Type: Epidural Level of consciousness: awake, awake and alert and oriented Pain management: pain level controlled Vital Signs Assessment: post-procedure vital signs reviewed and stable Respiratory status: spontaneous breathing, nonlabored ventilation and respiratory function stable Cardiovascular status: stable Postop Assessment: no headache, no backache, patient able to bend at knees, no signs of nausea or vomiting and adequate PO intake Anesthetic complications: no     Last Vitals:  Vitals:   11/23/16 0553 11/23/16 0712  BP: 96/63 104/68  Pulse: (!) 58 62  Resp: 16 18  Temp: 36.8 C 36.9 C    Last Pain:  Vitals:   11/23/16 0712  TempSrc: Oral  PainSc:                  Lyn RecordsNoles,  Yordy Matton R

## 2016-11-23 NOTE — Progress Notes (Signed)
Post Partum Day 1 Subjective: Wants to go home today  Objective: Blood pressure 104/68, pulse 62, temperature 98.5 F (36.9 C), temperature source Oral, resp. rate 18, height 5\' 4"  (1.626 m), weight 73.9 kg (163 lb), last menstrual period 03/19/2016, SpO2 97 %, unknown if currently breastfeeding.  Physical Exam:  General: alert, cooperative and appears stated age  CV: S1S2, RRR, NO M/R/G Lungs: CTA bilat, no W/R/R Lochia: appropriate Uterine Fundus: firm Incision: N/A  DVT Evaluation:Neg Homans   Recent Labs  11/22/16 1232 11/23/16 0405  HGB 11.7* 11.0*  HCT 35.4 32.9*    Assessment/Plan:  A:1. PPD#1 stable S/P NSVD with no lacs P: DC today   LOS: 1 day   Sharee Pimplearon W Rhet Rorke 11/23/2016, 9:07 AM

## 2016-11-23 NOTE — Lactation Note (Signed)
This note was copied from a baby's chart. Lactation Consultation Note  Patient Name: Girl [name] ZOXWR'UToday's Date: 11/23/2016 Reason for consult: Follow-up assessment   Maternal Data Formula Feeding for Exclusion: No Does the patient have breastfeeding experience prior to this delivery?: Yes Mom had bilateral breast biopsies after 2nd child was born, hurts to breastfeed on right, mostly hurts on lower areola, giving baby formula recently, states baby won't latch, I was able to latch baby easily to breast but full from formula and didn't suck only a few times on both breasts, pt states she only pumped right breast with manual pump last pregnancy because of pain with breastfeeding, may want to do this now, has a symphony breast pump in room to try but wants to try breastfeeding also, encouraged to breastfeed baby first then offer formula and informed her that this helped stimulate milk production, encouraged her to call for assistance if unable to get baby to latch to breaset     Feeding Feeding Type: Breast Fed Nipple Type: Slow - flow Length of feed:  (few sucks, sleepy)  LATCH Score/Interventions Latch: Repeated attempts needed to sustain latch, nipple held in mouth throughout feeding, stimulation needed to elicit sucking reflex. (sleepy, latches easily, won' t suck but few times)                    Lactation Tools Discussed/Used WIC Program: Yes Pump Review: Setup, frequency, and cleaning;Milk Storage Initiated by:: E Matthewson (pt thinks she may pump breast on right, hurts to breastfeed,) Date initiated:: 11/23/16   Consult Status Consult Status: PRN Date: 11/24/16 Follow-up type: In-patient    Dyann KiefMarsha D Aleksa Catterton 11/23/2016, 8:36 PM

## 2016-11-24 NOTE — Progress Notes (Signed)
Discharge instructions given. Patient verbalizes understanding of teaching. Patient discharged home at 0935.

## 2016-11-24 NOTE — Progress Notes (Addendum)
Post Partum Day 2 Subjective: I want to go home today  Objective: Blood pressure 102/64, pulse (!) 56, temperature 98.5 F (36.9 C), temperature source Oral, resp. rate 18, height 5\' 4"  (1.626 m), weight 73.9 kg (163 lb), last menstrual period 03/19/2016, SpO2 98 %, unknown if currently breastfeeding.  Physical Exam:  General: A,A&O x3 Heart:S1S2, RRR, No M/R/G Lungs:CTA bilat, no W/R/R Lochia: mod, no clots Uterine Fundus:FF and lochia mod Incision:N/A DVT Evaluation: Neg Homans   Recent Labs  11/22/16 1232 11/23/16 0405  HGB 11.7* 11.0*  HCT 35.4 32.9*    Assessment/Plan: A:1. PPD#2 S/P NSVD P: Pt was discharged yest but, did not go home. 2. Will dc home today   LOS: 2 days   Sharee PimpleCaron W Jones 11/24/2016, 8:20 AM

## 2017-04-30 NOTE — L&D Delivery Note (Signed)
Delivery Note VAGINAL DELIVERY NOTE:  Date of Delivery: 01/10/2018 Primary OB: [facility] Gestational Age/EDD: 1332w6d 01/11/2018, by Ultrasound Antepartum complications: none Attending Physician: Beatriz Stallion. Alasia Enge CNM  Delivery Type: spontaneous vaginal delivery  Anesthesia: epidural Laceration: none Episiotomy: none Placenta: spontaneous Intrapartum complications: None Estimated Blood Loss: 100 GBS: Positive  Procedure Details: Patient progressed to C/C/+2. AROM with mod clear fluid note at 0351.Marland Kitchen. Pt with good pushing efforts. FHR in 140's with moderate variability and variables noted.  Anterior shoulder, followed by posterior shoulder and body completely delivered at 0404. Infant placed on mom's chest and dried and stimulated with spontaneous cry and good tone.  Delayed cord clamping done, cord clamped x2 and cut by father of baby.  Spontaneous delivery of placenta, schultz side, w/ 3 VC at 0419.  Uterus firm, below umbilicus, with small rubra lochia, no clots. Perineum, vagina, side walls, and cervix inspected. Sponge count x 10  and 0 needle count correct.  EBL approximately 100 ml. Hemostasis achieved, VS stable. Mom and infant stable, breastfeeding, skin to skin bonding encouraged.   Baby: Liveborn female, Apgars 8/9, weight:3150  gms6 #15 oz, baby named Carrie Wagner   Pt  Was delivered by Margaretmary EddyAnna Mackie, RN, SNM assisted by C.Wannetta Langland, CNM _____________________________________________ Myrtie Cruisearon W. Karaline Buresh,RN, MSN, CNM, FNP Certified Nurse Midwife [facility]

## 2017-11-21 ENCOUNTER — Other Ambulatory Visit: Payer: Self-pay | Admitting: Family Medicine

## 2018-01-09 ENCOUNTER — Inpatient Hospital Stay
Admission: EM | Admit: 2018-01-09 | Discharge: 2018-01-11 | DRG: 806 | Disposition: A | Discharge status: Home / Self Care | Payer: Medicaid Other | Attending: Obstetrics and Gynecology | Admitting: Obstetrics and Gynecology

## 2018-01-09 ENCOUNTER — Other Ambulatory Visit: Payer: Self-pay

## 2018-01-09 DIAGNOSIS — Z23 Encounter for immunization: Secondary | ICD-10-CM | POA: Diagnosis not present

## 2018-01-09 DIAGNOSIS — O26893 Other specified pregnancy related conditions, third trimester: Secondary | ICD-10-CM | POA: Diagnosis not present

## 2018-01-09 DIAGNOSIS — O9081 Anemia of the puerperium: Secondary | ICD-10-CM | POA: Diagnosis not present

## 2018-01-09 DIAGNOSIS — Z3A39 39 weeks gestation of pregnancy: Secondary | ICD-10-CM | POA: Diagnosis not present

## 2018-01-09 DIAGNOSIS — M5430 Sciatica, unspecified side: Secondary | ICD-10-CM | POA: Diagnosis not present

## 2018-01-09 DIAGNOSIS — O99824 Streptococcus B carrier state complicating childbirth: Principal | ICD-10-CM | POA: Diagnosis present

## 2018-01-09 DIAGNOSIS — D62 Acute posthemorrhagic anemia: Secondary | ICD-10-CM | POA: Diagnosis not present

## 2018-01-09 DIAGNOSIS — Z3483 Encounter for supervision of other normal pregnancy, third trimester: Secondary | ICD-10-CM | POA: Diagnosis present

## 2018-01-09 LAB — CBC
HCT: 33.9 % — ABNORMAL LOW (ref 35.0–47.0)
HEMOGLOBIN: 11.4 g/dL — AB (ref 12.0–16.0)
MCH: 27.2 pg (ref 26.0–34.0)
MCHC: 33.6 g/dL (ref 32.0–36.0)
MCV: 81 fL (ref 80.0–100.0)
PLATELETS: 188 10*3/uL (ref 150–440)
RBC: 4.19 MIL/uL (ref 3.80–5.20)
RDW: 15.8 % — ABNORMAL HIGH (ref 11.5–14.5)
WBC: 10.1 10*3/uL (ref 3.6–11.0)

## 2018-01-09 LAB — TYPE AND SCREEN
ABO/RH(D): O POS
Antibody Screen: NEGATIVE

## 2018-01-09 MED ORDER — OXYTOCIN 10 UNIT/ML IJ SOLN
INTRAMUSCULAR | Status: AC
  Filled 2018-01-09: qty 2

## 2018-01-09 MED ORDER — OXYTOCIN 40 UNITS IN LACTATED RINGERS INFUSION - SIMPLE MED
2.5000 [IU]/h | INTRAVENOUS | Status: DC
  Administered 2018-01-10: 2.5 [IU]/h via INTRAVENOUS
  Filled 2018-01-09: qty 1000

## 2018-01-09 MED ORDER — ACETAMINOPHEN 325 MG PO TABS
650.0000 mg | ORAL_TABLET | ORAL | Status: DC | PRN
  Administered 2018-01-10: 650 mg via ORAL

## 2018-01-09 MED ORDER — SOD CITRATE-CITRIC ACID 500-334 MG/5ML PO SOLN: 30.0000 mL | ORAL | Status: DC | PRN

## 2018-01-09 MED ORDER — LACTATED RINGERS IV SOLN: 500.0000 mL | INTRAVENOUS | Status: DC | PRN

## 2018-01-09 MED ORDER — SODIUM CHLORIDE 0.9 % IV SOLN
2.0000 g | Freq: Once | INTRAVENOUS | Status: AC
  Administered 2018-01-09: 2 g via INTRAVENOUS
  Filled 2018-01-09: qty 2000

## 2018-01-09 MED ORDER — LIDOCAINE HCL (PF) 1 % IJ SOLN: 30.0000 mL | INTRAMUSCULAR | Status: DC | PRN

## 2018-01-09 MED ORDER — OXYTOCIN BOLUS FROM INFUSION
500.0000 mL | Freq: Once | INTRAVENOUS | Status: AC
  Administered 2018-01-10: 500 mL via INTRAVENOUS

## 2018-01-09 MED ORDER — ONDANSETRON HCL 4 MG/2ML IJ SOLN: 4.0000 mg | INTRAMUSCULAR | Status: DC | PRN

## 2018-01-09 MED ORDER — BUTORPHANOL TARTRATE 2 MG/ML IJ SOLN: 1.0000 mg | INTRAMUSCULAR | Status: DC | PRN

## 2018-01-09 MED ORDER — SODIUM CHLORIDE 0.9 % IV SOLN
1.0000 g | INTRAVENOUS | Status: DC
  Administered 2018-01-10: 1 g via INTRAVENOUS
  Filled 2018-01-09: qty 1000

## 2018-01-09 MED ORDER — ONDANSETRON HCL 4 MG PO TABS: 4.0000 mg | ORAL_TABLET | Freq: Four times a day (QID) | ORAL | Status: DC | PRN

## 2018-01-09 MED ORDER — AMMONIA AROMATIC IN INHA
RESPIRATORY_TRACT | Status: AC
  Filled 2018-01-09: qty 10

## 2018-01-09 MED ORDER — LACTATED RINGERS IV SOLN
INTRAVENOUS | Status: DC
  Administered 2018-01-09: 23:00:00 via INTRAVENOUS

## 2018-01-09 MED ORDER — MISOPROSTOL 200 MCG PO TABS
ORAL_TABLET | ORAL | Status: AC
  Filled 2018-01-09: qty 4

## 2018-01-09 NOTE — Progress Notes (Signed)
Carrie FerrierMaria Morales Wagner is a 27 y.o. [phone] at 5150w5d by ultrasound admitted for active labor.  Subjective: Tolerating contractions well. Declines epidural currently.  Reports leg and sciatic pain with contractions. Denies leaking or bleeding, endorses good fetal movement.   Objective: BP 105/73   Pulse 70   Temp 98.8 F (37.1 C) (Oral)   Resp 18   Ht 5\' 3"  (1.6 m)   Wt 70.3 kg   LMP  (LMP Unknown)   BMI 27.46 kg/m  No intake/output data recorded. No intake/output data recorded.  FHT:  FHR: 135 bpm, variability: moderate,  accelerations:  Present,  decelerations:  Absent, Cat1 UC:   regular, every 3-5 minutes SVE:   Dilation: 4 Effacement (%): 70 Station: -1 Exam by:: midwife student, Margaretmary EddyAnna Mackie, SNM  Vtx on exam  Labs: Pending   Assessment / Plan: Spontaneous labor, progressing normally  Labor: Progressing normally  GBS Prophylaxis initiated  Fetal Wellbeing:  Category I Pain Control:  Epidural, plans  Anticipated MOD:  NSVD  _____________________________________________  Myrtie Cruisearon W. Jones,RN, MSN, CNM, FNP Certified Nurse Midwife [facility]

## 2018-01-09 NOTE — H&P (Signed)
OB History & Physical   History of Present Illness:  Chief Complaint: Presents to L&D with report of contractions starting around 4 pm yesterday and worsening around 11 am today.  Pt   HPI: Carrie Wagner is a 27 y.o. [id number] female at 35 5/7 weeks dated by 80 week [place] at [facility].  She presents to L&D for worsening contractions since 11 am today.  She is currently a patient of [facility] x 3 visits. Prenatal records currently not available.  Denies leaking and bleeding. Endorses good fetal movement.   Pregnancy Issues: 1. Limited prenatal care  2. Close interval pregnancies  3. GBS pos   Maternal Medical History:   Past Medical History:  Diagnosis Date  . Medical history non-contributory     Past Surgical History:  Procedure Laterality Date  . NO PAST SURGERIES      No Known Allergies  Prior to Admission medications   Medication Sig Start Date End Date Taking? Authorizing Provider  Prenatal Vit-Fe Fumarate-FA (MULTIVITAMIN-PRENATAL) 27-0.8 MG TABS tablet Take 1 tablet by mouth daily at 12 noon.    [provider]    Prenatal care site:  [facility] (Very limited OB care and no records found but, pt did get Anatomy scan [place] by [facility] and it was WNL. Also had GBS which was positive.   Social History: She  reports that she has never smoked. She has never used smokeless tobacco. She reports that she does not drink alcohol or use drugs.  Family History: Denies significant family history.   Review of Systems: Review of Systems  Constitutional: Negative.   HENT: Negative.   Eyes: Negative.   Respiratory: Negative.   Cardiovascular: Negative.   Gastrointestinal: Negative.   Genitourinary: Negative.   Musculoskeletal: Positive for myalgias.  Skin: Negative.   Neurological: Negative.   Endo/Heme/Allergies: Negative.   Psychiatric/Behavioral: Negative.   +Lt sided sciatica Physical Exam:  Vital Signs: BP 106/67 (BP Location: Left Arm)   Pulse 63   Temp 98.8 F  (37.1 C) (Oral)   Resp 18   Ht 5\' 3"  (1.6 m)   Wt 70.3 kg   BMI 27.46 kg/m  General: no acute distress, A& O x 3 HEENT: normocephalic, atraumatic Heart: regular rate & rhythm.  No murmurs/rubs/gallops Lungs: clear to auscultation bilaterally, normal respiratory effort Abdomen: soft, gravid, non-tender;  EFW: 6#0oz Pelvic:   External: Normal external female genitalia  Cervix: Dilation: 2.5 / Effacement (%): 70 / Station: -2    Extremities: non-tender, symmetric, no edema noted bilaterally.  Neurologic: Alert & oriented x 3.    No results found for this or any previous visit (from the past 24 hour(s)).  Pertinent Results:  Prenatal Labs: Blood type/Rh O Posivite  Antibody screen neg  Rubella Immune  Varicella Immune  RPR NR  HBsAg Neg  HIV NR  GC neg  Chlamydia neg  Genetic screening negative  1 hour GTT Done per pt report but, no labs found  3 hour GTT   GBS Positive    GBS Positive FHT: 135, mod variability, + accels, no decels  TOCO: ctx 4-7 min, mild to mod to palpation  SVE:  Dilation: 2.5 / Effacement (%): 70 / Station: -2    Cephalic by leopolds   Assessment:  Carrie Wagner is a 27 y.o. [id number] female at 73 5/7 weeks. NST is reactive.   Plan:  1. Admit to Labor & Delivery 2. CBC, T&S, Clrs, IVF 3. GBS Positive  4. Consents  obtained. 5. Continuous efm/toco 6. Expectant management  ------------------------------------------------------------ Myrtie Cruisearon W. Adilynne Fitzwater,RN, MSN, CNM, FNP Certified Nurse Midwife [facility]

## 2018-01-09 NOTE — OB Triage Note (Signed)
Patient presents with UC since yesterday, on-going. G-4 P-3, 39+ weeks.  Denies any vaginal bleeding, or s/s SROM. efm and toco applied. fhr-stable. Abdomen soft, non-tender. Patient tolerating contractions well. FOB supportive at bedside.

## 2018-01-09 NOTE — Discharge Summary (Signed)
Obstetric Discharge Summary   Patient ID: Patient Name: Carrie Wagner DOB: [birth year] MRN: [id number]  Date of Admission: 01/09/2018 Date of Delivery: 01/10/2018 Delivered by: Margaretmary EddyAnna Mackie SNM, Milon Scorearon Jones CNM  Date of Discharge: 01/12/2018  Primary OB: [facility]  LMP:No LMP recorded (lmp unknown). EDC Estimated Date of Delivery: 01/11/18 Gestational Age at Delivery: 3184w6d   Antepartum complications: None  Admitting Diagnosis: Term pregnancy in active labor, GBS positive   Secondary Diagnoses: Patient Active Problem List   Diagnosis Date Noted  . Indication for care in labor and delivery, antepartum 01/09/2018  . Indication for care or intervention in labor or delivery 01/09/2018  . Labor and delivery, indication for care 11/22/2016   Augmentation: None Complications: None Intrapartum complications/course:  Delivery Type: spontaneous vaginal delivery Anesthesia: epidural Placenta: spontaneous, 3VC, no mec staining, marginal cord insertion  Laceration: None Episiotomy: none  Newborn Data:  Birth date/time: 01/10/2018 at 0404  Delivery type: Vaginal, spontaneous    Postpartum Course  Patient had an uncomplicated postpartum course.  By time of discharge on PPD#1, her pain was controlled on oral pain medications; she had appropriate lochia and was ambulating, voiding without difficulty and tolerating regular diet. She was deemed stable for discharge to home.      Labs: CBC Latest Ref Rng & Units 01/11/2018 01/09/2018 11/23/2016  WBC 3.6 - 11.0 K/uL 10.7 10.1 13.5(H)  Hemoglobin 12.0 - 16.0 g/dL 10.0(L) 11.4(L) 11.0(L)  Hematocrit 35.0 - 47.0 % 28.6(L) 33.9(L) 32.9(L)  Platelets 150 - 440 K/uL 157 188 167   O POS  Physical exam:  BP 108/70 (BP Location: Left Arm)   Pulse 60   Temp 97.8 F (36.6 C) (Oral)   Resp 20   Ht 5\' 3"  (1.6 m)   Wt 70.3 kg   LMP  (LMP Unknown)   SpO2 98%   Breastfeeding? Unknown   BMI 27.46 kg/m  General: alert and no  distress Pulm: normal respiratory effort Lochia: appropriate Abdomen: soft, NT Uterine Fundus: firm, below umbilicus Extremities: No evidence of DVT seen on physical exam. No lower extremity edema.  Disposition: stable, discharge to home Baby Feeding: breastmilk and formula Baby Disposition: home with mom  Contraception: Nexplanon   Prenatal Labs:  O Positive  Rubella immune Varicella immune RPR NR HIV NR GC/Chlam neg GBS positive   Rh Immune globulin given: n/a  Plan:  Carrie Wagner was discharged to home in good condition. [facility] delivery provider in 6 weeks(CJones, CNM).  Discharge Instructions: Per After Visit Summary. Activity: Advance as tolerated. Pelvic rest for 6 weeks.   Diet: Regular Discharge Medications: Allergies as of 01/11/2018   No Known Allergies     Medication List    TAKE these medications   acetaminophen 325 MG tablet Commonly known as:  TYLENOL Take 2 tablets (650 mg total) by mouth every 4 (four) hours as needed (for pain scale < 4).   ibuprofen 600 MG tablet Commonly known as:  ADVIL,MOTRIN Take 1 tablet (600 mg total) by mouth every 6 (six) hours.   multivitamin-prenatal 27-0.8 MG Tabs tablet Take 1 tablet by mouth daily at 12 noon.   senna-docusate 8.6-50 MG tablet Commonly known as:  Senokot-S Take 2 tablets by mouth at bedtime.      Outpatient follow up:  Follow-up Information    Sharee PimpleJones, Caron W, CNM Follow up in 6 week(s).   Specialty:  Obstetrics and Gynecology Contact information: [address] [facility] [place] [address] [phone]         6 weeks at [facility]   Signed:  Dala Dock, Ilithyia Titzer A 01/12/2018 5:02 AM

## 2018-01-10 ENCOUNTER — Encounter: Payer: Self-pay | Admitting: Anesthesiology

## 2018-01-10 ENCOUNTER — Inpatient Hospital Stay: Payer: Medicaid Other | Admitting: Anesthesiology

## 2018-01-10 MED ORDER — IBUPROFEN 600 MG PO TABS
600.0000 mg | ORAL_TABLET | Freq: Four times a day (QID) | ORAL | Status: DC
  Administered 2018-01-10 – 2018-01-11 (×5): 600 mg via ORAL
  Filled 2018-01-10 (×5): qty 1

## 2018-01-10 MED ORDER — SODIUM CHLORIDE 0.9% FLUSH: 3.0000 mL | Freq: Two times a day (BID) | INTRAVENOUS | Status: DC

## 2018-01-10 MED ORDER — PHENYLEPHRINE 40 MCG/ML (10ML) SYRINGE FOR IV PUSH (FOR BLOOD PRESSURE SUPPORT): 80.0000 ug | PREFILLED_SYRINGE | INTRAVENOUS | Status: DC | PRN

## 2018-01-10 MED ORDER — FLEET ENEMA 7-19 GM/118ML RE ENEM: 1.0000 | ENEMA | Freq: Every day | RECTAL | Status: DC | PRN

## 2018-01-10 MED ORDER — TETANUS-DIPHTH-ACELL PERTUSSIS 5-2.5-18.5 LF-MCG/0.5 IM SUSP
0.5000 mL | Freq: Once | INTRAMUSCULAR | Status: AC
  Administered 2018-01-11: 0.5 mL via INTRAMUSCULAR
  Filled 2018-01-10: qty 0.5

## 2018-01-10 MED ORDER — ONDANSETRON HCL 4 MG/2ML IJ SOLN: 4.0000 mg | INTRAMUSCULAR | Status: DC | PRN

## 2018-01-10 MED ORDER — BENZOCAINE-MENTHOL 20-0.5 % EX AERO
1.0000 "application " | INHALATION_SPRAY | CUTANEOUS | Status: DC | PRN
  Administered 2018-01-10: 1 via TOPICAL
  Filled 2018-01-10: qty 56

## 2018-01-10 MED ORDER — ONDANSETRON HCL 4 MG PO TABS: 4.0000 mg | ORAL_TABLET | ORAL | Status: DC | PRN

## 2018-01-10 MED ORDER — DIPHENHYDRAMINE HCL 25 MG PO CAPS: 25.0000 mg | ORAL_CAPSULE | Freq: Four times a day (QID) | ORAL | Status: DC | PRN

## 2018-01-10 MED ORDER — ACETAMINOPHEN 325 MG PO TABS
650.0000 mg | ORAL_TABLET | ORAL | Status: DC | PRN
  Administered 2018-01-10: 650 mg via ORAL
  Filled 2018-01-10 (×2): qty 2

## 2018-01-10 MED ORDER — PRENATAL MULTIVITAMIN CH
1.0000 | ORAL_TABLET | Freq: Every day | ORAL | Status: DC
  Administered 2018-01-10 – 2018-01-11 (×2): 1 via ORAL
  Filled 2018-01-10 (×2): qty 1

## 2018-01-10 MED ORDER — COCONUT OIL OIL: 1.0000 "application " | TOPICAL_OIL | Status: DC | PRN

## 2018-01-10 MED ORDER — DIBUCAINE 1 % RE OINT: 1.0000 "application " | TOPICAL_OINTMENT | RECTAL | Status: DC | PRN

## 2018-01-10 MED ORDER — DIPHENHYDRAMINE HCL 50 MG/ML IJ SOLN: 12.5000 mg | INTRAMUSCULAR | Status: DC | PRN

## 2018-01-10 MED ORDER — SIMETHICONE 80 MG PO CHEW: 80.0000 mg | CHEWABLE_TABLET | ORAL | Status: DC | PRN

## 2018-01-10 MED ORDER — EPHEDRINE 5 MG/ML INJ: 10.0000 mg | INTRAVENOUS | Status: DC | PRN

## 2018-01-10 MED ORDER — SENNOSIDES-DOCUSATE SODIUM 8.6-50 MG PO TABS
2.0000 | ORAL_TABLET | ORAL | Status: DC
  Administered 2018-01-11: 2 via ORAL
  Filled 2018-01-10: qty 2

## 2018-01-10 MED ORDER — FENTANYL 2.5 MCG/ML W/ROPIVACAINE 0.15% IN NS 100 ML EPIDURAL (ARMC)
12.0000 mL/h | EPIDURAL | Status: DC
  Administered 2018-01-10 (×2): 12 mL/h via EPIDURAL

## 2018-01-10 MED ORDER — OXYTOCIN 40 UNITS IN LACTATED RINGERS INFUSION - SIMPLE MED
INTRAVENOUS | Status: AC
  Administered 2018-01-10: 12:00:00
  Filled 2018-01-10: qty 1000

## 2018-01-10 MED ORDER — BISACODYL 10 MG RE SUPP: 10.0000 mg | Freq: Every day | RECTAL | Status: DC | PRN

## 2018-01-10 MED ORDER — BUPIVACAINE HCL (PF) 0.25 % IJ SOLN
INTRAMUSCULAR | Status: DC | PRN
  Administered 2018-01-10: 5 mL via EPIDURAL

## 2018-01-10 MED ORDER — SODIUM CHLORIDE 0.9 % IV SOLN: 250.0000 mL | INTRAVENOUS | Status: DC | PRN

## 2018-01-10 MED ORDER — LACTATED RINGERS IV SOLN
500.0000 mL | Freq: Once | INTRAVENOUS | Status: AC
  Administered 2018-01-10: 1000 mL via INTRAVENOUS

## 2018-01-10 MED ORDER — FENTANYL 2.5 MCG/ML W/ROPIVACAINE 0.15% IN NS 100 ML EPIDURAL (ARMC)
EPIDURAL | Status: AC
  Filled 2018-01-10: qty 100

## 2018-01-10 MED ORDER — SODIUM CHLORIDE 0.9% FLUSH: 3.0000 mL | INTRAVENOUS | Status: DC | PRN

## 2018-01-10 MED ORDER — ZOLPIDEM TARTRATE 5 MG PO TABS: 5.0000 mg | ORAL_TABLET | Freq: Every evening | ORAL | Status: DC | PRN

## 2018-01-10 MED ORDER — OXYCODONE HCL 5 MG PO TABS: 5.0000 mg | ORAL_TABLET | ORAL | Status: DC | PRN

## 2018-01-10 MED ORDER — WITCH HAZEL-GLYCERIN EX PADS: 1.0000 "application " | MEDICATED_PAD | CUTANEOUS | Status: DC | PRN

## 2018-01-10 NOTE — Progress Notes (Signed)
Post Partum Day 0 Subjective: Doing well, no complaints.  Tolerating regular diet, pain with PO meds, voiding and ambulating without difficulty.  No CP SOB Fever,Chills, N/V or leg pain; denies nipple or breast pain, no HA change of vision, RUQ/epigastric pain  Objective: BP 98/62 (BP Location: Right Arm)   Pulse (!) 58   Temp 98.2 F (36.8 C) (Oral)   Resp 18   Ht 5\' 3"  (1.6 m)   Wt 70.3 kg   LMP  (LMP Unknown)   SpO2 99%   BMI 27.46 kg/m    Physical Exam:  General: NAD Breasts: soft/nontender CV: RRR Pulm: nl effort, CTABL Abdomen: soft, NT, BS x 4 Perineum: minimal edema, intact Lochia: moderate Uterine Fundus: fundus firm and 2 fb below umbilicus DVT Evaluation: no cords, ttp LEs   Recent Labs    01/09/18 2241  HGB 11.4*  HCT 33.9*  WBC 10.1  PLT 188    Assessment/Plan: 27 y.o. [id number] postpartum day # 0  - Continue routine PP care - Lactation consult prn.  - Discussed contraceptive options including implant, IUDs hormonal and non-hormonal, injection, pills/ring/patch, condoms, and NFP. Pt plans Nexplanon - Immunization status: Needs tdap prior to DC - no prenatal recs available.    Disposition: Does not desire Dc home today.     Carrie Wagner A, CNM 01/10/2018  7:53 PM

## 2018-01-10 NOTE — Anesthesia Preprocedure Evaluation (Signed)
Anesthesia Evaluation  Patient identified by MRN, date of birth, ID band Patient awake    Reviewed: Allergy & Precautions, NPO status , Patient's Chart, lab work & pertinent test results, reviewed documented beta blocker date and time   Airway Mallampati: II  TM Distance: >3 FB     Dental  (+) Chipped   Pulmonary           Cardiovascular      Neuro/Psych    GI/Hepatic   Endo/Other    Renal/GU      Musculoskeletal   Abdominal   Peds  Hematology   Anesthesia Other Findings   Reproductive/Obstetrics                             Anesthesia Physical Anesthesia Plan  ASA: II  Anesthesia Plan: Epidural   Post-op Pain Management:    Induction:   PONV Risk Score and Plan:   Airway Management Planned:   Additional Equipment:   Intra-op Plan:   Post-operative Plan:   Informed Consent: I have reviewed the patients History and Physical, chart, labs and discussed the procedure including the risks, benefits and alternatives for the proposed anesthesia with the patient or authorized representative who has indicated his/her understanding and acceptance.     Plan Discussed with: CRNA  Anesthesia Plan Comments:         Anesthesia Quick Evaluation  

## 2018-01-10 NOTE — Anesthesia Procedure Notes (Signed)
Epidural Patient location during procedure: OB  Staffing Anesthesiologist: Mikeal Winstanley, MD Performed: anesthesiologist   Preanesthetic Checklist Completed: patient identified, site marked, surgical consent, pre-op evaluation, timeout performed, IV checked, risks and benefits discussed and monitors and equipment checked  Epidural Patient position: sitting Prep: ChloraPrep Patient monitoring: heart rate, continuous pulse ox and blood pressure Approach: midline Location: L4-L5 Injection technique: LOR saline  Needle:  Needle type: Tuohy  Needle gauge: 18 G Needle length: 9 cm and 9 Catheter type: closed end flexible Catheter size: 20 Guage Test dose: negative and 1.5% lidocaine with Epi 1:200 K  Assessment Sensory level: T10 Events: blood not aspirated, injection not painful, no injection resistance, negative IV test and no paresthesia  Additional Notes   Patient tolerated the insertion well without complications.Reason for block:procedure for pain     

## 2018-01-10 NOTE — Discharge Instructions (Signed)
Care After Vaginal Delivery °Congratulations on your new baby!! ° °Refer to this sheet in the next few weeks. These discharge instructions provide you with information on caring for yourself after delivery. Your caregiver may also give you specific instructions. Your treatment has been planned according to the most current medical practices available, but problems sometimes occur. Call your caregiver if you have any problems or questions after you go home. ° °HOME CARE INSTRUCTIONS °· Take over-the-counter or prescription medicines only as directed by your caregiver or pharmacist. °· Do not drink alcohol, especially if you are breastfeeding or taking medicine to relieve pain. °· Do not chew or smoke tobacco. °· Do not use illegal drugs. °· Continue to use good perineal care. Good perineal care includes: °¨ Wiping your perineum from front to back. °¨ Keeping your perineum clean. °· Do not use tampons or douche until your caregiver says it is okay. °· Shower, wash your hair, and take tub baths as directed by your caregiver. °· Wear a well-fitting bra that provides breast support. °· Eat healthy foods. °· Drink enough fluids to keep your urine clear or pale yellow. °· Eat high-fiber foods such as whole grain cereals and breads, brown rice, beans, and fresh fruits and vegetables every day. These foods may help prevent or relieve constipation. °· Follow your caregiver's recommendations regarding resumption of activities such as climbing stairs, driving, lifting, exercising, or traveling. Specifically, no driving for two weeks, so that you are comfortable reacting quickly in an emergency. °· Talk to your caregiver about resuming sexual activities. Resumption of sexual activities is dependent upon your risk of infection, your rate of healing, and your comfort and desire to resume sexual activity. Usually we recommend waiting about six weeks, or until your bleeding stops and you are interested in sex. °· Try to have someone  help you with your household activities and your newborn for at least a few days after you leave the hospital. Even longer is better. °· Rest as much as possible. Try to rest or take a nap when your newborn is sleeping. Sleep deprivation can be very hard after delivery. °· Increase your activities gradually. °· Keep all of your scheduled postpartum appointments. It is very important to keep your scheduled follow-up appointments. At these appointments, your caregiver will be checking to make sure that you are healing physically and emotionally. ° °SEEK MEDICAL CARE IF:  °· You are passing large clots from your vagina.  °· You have a foul smelling discharge from your vagina. °· You have trouble urinating. °· You are urinating frequently. °· You have pain when you urinate. °· You have a change in your bowel movements. °· You have increasing redness, pain, or swelling near your vaginal incision (episiotomy) or vaginal tear. °· You have pus draining from your episiotomy or vaginal tear. °· Your episiotomy or vaginal tear is separating. °· You have painful, hard, or reddened breasts. °· You have a severe headache. °· You have blurred vision or see spots. °· You feel sad or depressed. °· You have thoughts of hurting yourself or your newborn. °· You have questions about your care, the care of your newborn, or medicines. °· You are dizzy or light-headed. °· You have a rash. °· You have nausea or vomiting. °· You were breastfeeding and have not had a menstrual period within 12 weeks after you stopped breastfeeding. °· You are not breastfeeding and have not had a menstrual period by the 12th week after delivery. °· You   have a fever. ° °SEEK IMMEDIATE MEDICAL CARE IF:  °· You have persistent pain. °· You have chest pain. °· You have shortness of breath. °· You faint. °· You have leg pain. °· You have stomach pain. °· Your vaginal bleeding saturates two or more sanitary pads in 1 hour. ° °MAKE SURE YOU:  °· Understand these  instructions. °· Will get help right away if you are not doing well or get worse. °·  °Document Released: 04/13/2000 Document Revised: 08/31/2013 Document Reviewed: 12/12/2011 ° °ExitCare® Patient Information ©2015 [employer]. This information is not intended to replace advice given to you by your health care provider. Make sure you discuss any questions you have with your health care provider. ° °

## 2018-01-11 LAB — CBC
HCT: 28.6 % — ABNORMAL LOW (ref 35.0–47.0)
Hemoglobin: 10 g/dL — ABNORMAL LOW (ref 12.0–16.0)
MCH: 28.6 pg (ref 26.0–34.0)
MCHC: 35.1 g/dL (ref 32.0–36.0)
MCV: 81.4 fL (ref 80.0–100.0)
PLATELETS: 157 10*3/uL (ref 150–440)
RBC: 3.51 MIL/uL — AB (ref 3.80–5.20)
RDW: 16.1 % — ABNORMAL HIGH (ref 11.5–14.5)
WBC: 10.7 10*3/uL (ref 3.6–11.0)

## 2018-01-11 LAB — HIV ANTIBODY (ROUTINE TESTING W REFLEX): HIV SCREEN 4TH GENERATION: NONREACTIVE

## 2018-01-11 LAB — RPR: RPR: NONREACTIVE

## 2018-01-11 MED ORDER — INFLUENZA VAC SPLIT QUAD 0.5 ML IM SUSY: 0.5000 mL | PREFILLED_SYRINGE | INTRAMUSCULAR | Status: DC

## 2018-01-11 MED ORDER — IBUPROFEN 600 MG PO TABS: 600.0000 mg | ORAL_TABLET | Freq: Four times a day (QID) | ORAL | 0 refills | Status: DC

## 2018-01-11 MED ORDER — ACETAMINOPHEN 325 MG PO TABS: 650.0000 mg | ORAL_TABLET | ORAL | Status: DC | PRN

## 2018-01-11 MED ORDER — SENNOSIDES-DOCUSATE SODIUM 8.6-50 MG PO TABS: 2.0000 | ORAL_TABLET | Freq: Every day | ORAL | Status: DC

## 2018-01-11 MED ORDER — INFLUENZA VAC SPLIT QUAD 0.5 ML IM SUSY
0.5000 mL | PREFILLED_SYRINGE | Freq: Once | INTRAMUSCULAR | Status: AC
  Administered 2018-01-11: 0.5 mL via INTRAMUSCULAR
  Filled 2018-01-11: qty 0.5

## 2018-01-11 NOTE — Progress Notes (Signed)
Provided period of purple cry video for mother to watch. Addressed questions/concerns regarding information provided. Mother had watched the video in the past with other children's births.

## 2018-01-11 NOTE — Anesthesia Postprocedure Evaluation (Signed)
Anesthesia Post Note  Patient: Carrie FerrierMaria Morales Wagner  Procedure(s) Performed: AN AD HOC LABOR EPIDURAL  Patient location during evaluation: Mother Baby Anesthesia Type: Epidural Level of consciousness: awake and alert and oriented Pain management: pain level controlled Vital Signs Assessment: post-procedure vital signs reviewed and stable Respiratory status: spontaneous breathing Cardiovascular status: blood pressure returned to baseline Postop Assessment: no headache and no backache Anesthetic complications: no     Last Vitals:  Vitals:   01/11/18 0045 01/11/18 0943  BP: (!) 86/48 108/70  Pulse: (!) 59 60  Resp: 18 20  Temp: 36.6 C 36.6 C  SpO2: 99% 98%    Last Pain:  Vitals:   01/11/18 0943  TempSrc: Oral  PainSc:                  [name]

## 2018-01-11 NOTE — Progress Notes (Signed)
Post Partum Day 1 Subjective: Doing well, no complaints.  Tolerating regular diet, pain with PO meds, voiding and ambulating without difficulty.  No CP SOB Fever,Chills, N/V or leg pain; denies nipple or breast pain; no HA change of vision, RUQ/epigastric pain  Objective: BP (!) 86/48 (BP Location: Right Arm) Comment: denies lightheaded/dizzy, resting in bed, bleeding improved  Pulse (!) 59   Temp 97.8 F (36.6 C) (Oral)   Resp 18   Ht 5\' 3"  (1.6 m)   Wt 70.3 kg   LMP  (LMP Unknown)   SpO2 99%   BMI 27.46 kg/m    Physical Exam:  General: NAD Breasts: soft/nontender CV: RRR Pulm: nl effort, CTABL Abdomen: soft, NT, BS x 4 Perineum: minimal edema, intact Lochia: small Uterine Fundus: fundus firm and 2 fb below umbilicus DVT Evaluation: no cords, ttp LEs   Recent Labs    01/09/18 2241 01/11/18 0444  HGB 11.4* 10.0*  HCT 33.9* 28.6*  WBC 10.1 10.7  PLT 188 157    Assessment/Plan: 27 y.o. [id number] postpartum day # 1  - Continue routine PP care - Lactation consult: prn.  - Discussed contraceptive options including implant, IUDs hormonal and non-hormonal, injection, pills/ring/patch, condoms, and NFP. Desires Nexplanon - Acute blood loss anemia - hemodynamically stable and asymptomatic - Immunization status: Needs tdap prior to DC   Disposition: Does desire Dc home today.     Thania Woodlief A, CNM 01/11/2018  9:38 AM

## 2021-04-30 NOTE — L&D Delivery Note (Signed)
Date of delivery: 04/11/2022 Estimated Date of Delivery: 05/05/22 by 36 week ultrasound No LMP recorded (lmp unknown). Patient is pregnant. EGA: [redacted]w[redacted]d  Delivery Note At 6:14 PM a viable female was delivered via Vaginal, Spontaneous  Presentation: OA, Left Occiput Anterior   APGAR: 8, 9;       Weight: 3200 g, 7 pounds 1 ounce Placenta status: Spontaneous, Intact.   Cord: 3 vessels with the following complications: None.  Cord pH: NA  Began pushing at complete and +1. Mom pushed well to deliver a viable female infant.  The head followed by shoulders, which delivered without difficulty, and the rest of the body.  No nuchal cord noted.  Baby to mom's chest.  Cord clamped and cut after 3 min delay.  Cord blood obtained.  Placenta delivered spontaneously, intact, with a 3-vessel cord.  All counts correct.  Hemostasis obtained with IV pitocin and fundal massage.  No prenatal care this pregnancy. Patient thought she was [redacted] weeks pregnant. Ultrasound today dates patient at [redacted]w[redacted]d. One dose of Betamethasone administered prior to induction. Newborn has full term appearance. Patient's husband is en route from [place].   Anesthesia: Epidural Episiotomy: None Lacerations: None Suture Repair:  NA Est. Blood Loss (mL): 350  Mom to postpartum.  Baby to Couplet care / Skin to Skin.  Tresea Mall, CNM 04/11/2022, 7:00 PM

## 2021-07-19 ENCOUNTER — Other Ambulatory Visit: Payer: Self-pay

## 2021-07-19 ENCOUNTER — Encounter: Payer: Self-pay | Admitting: Emergency Medicine

## 2021-07-19 ENCOUNTER — Emergency Department
Admission: EM | Admit: 2021-07-19 | Discharge: 2021-07-19 | Disposition: A | Discharge status: Home / Self Care | Payer: Self-pay | Attending: Emergency Medicine | Admitting: Emergency Medicine

## 2021-07-19 DIAGNOSIS — Z20822 Contact with and (suspected) exposure to covid-19: Secondary | ICD-10-CM | POA: Insufficient documentation

## 2021-07-19 DIAGNOSIS — J029 Acute pharyngitis, unspecified: Secondary | ICD-10-CM

## 2021-07-19 DIAGNOSIS — Z79899 Other long term (current) drug therapy: Secondary | ICD-10-CM | POA: Insufficient documentation

## 2021-07-19 DIAGNOSIS — J039 Acute tonsillitis, unspecified: Secondary | ICD-10-CM | POA: Insufficient documentation

## 2021-07-19 LAB — RESP PANEL BY RT-PCR (FLU A&B, COVID) ARPGX2
Influenza A by PCR: NEGATIVE
Influenza B by PCR: NEGATIVE
SARS Coronavirus 2 by RT PCR: NEGATIVE

## 2021-07-19 LAB — GROUP A STREP BY PCR: Group A Strep by PCR: NOT DETECTED

## 2021-07-19 MED ORDER — IBUPROFEN 600 MG PO TABS
600.0000 mg | ORAL_TABLET | Freq: Once | ORAL | Status: AC
  Administered 2021-07-19: 600 mg via ORAL
  Filled 2021-07-19: qty 1

## 2021-07-19 MED ORDER — DEXAMETHASONE SODIUM PHOSPHATE 10 MG/ML IJ SOLN
10.0000 mg | Freq: Once | INTRAMUSCULAR | Status: AC
  Administered 2021-07-19: 10 mg via INTRAMUSCULAR
  Filled 2021-07-19: qty 1

## 2021-07-19 MED ORDER — AMOXICILLIN-POT CLAVULANATE 875-125 MG PO TABS
1.0000 | ORAL_TABLET | Freq: Once | ORAL | Status: AC
  Administered 2021-07-19: 1 via ORAL
  Filled 2021-07-19: qty 1

## 2021-07-19 MED ORDER — MAGIC MOUTHWASH
10.0000 mL | Freq: Once | ORAL | Status: AC
  Administered 2021-07-19: 10 mL via ORAL
  Filled 2021-07-19: qty 10

## 2021-07-19 MED ORDER — AMOXICILLIN-POT CLAVULANATE 875-125 MG PO TABS: 1.0000 | ORAL_TABLET | Freq: Two times a day (BID) | ORAL | 0 refills | Status: DC

## 2021-07-19 MED ORDER — MAGIC MOUTHWASH
10.0000 mL | Freq: Once | ORAL | Status: DC
  Filled 2021-07-19: qty 10

## 2021-07-19 MED ORDER — MAGIC MOUTHWASH: ORAL | 0 refills | Status: DC

## 2021-07-19 NOTE — ED Provider Notes (Signed)
? ?[facility] ?Provider Note ? ? ? Event Date/Time  ? First MD Initiated Contact with Patient 07/19/21 0103   ?  (approximate) ? ? ?History  ? ?Sore Throat ? ? ?HPI ? ?Carrie Wagner is a 31 y.o. female presents to the ED from home with a chief complaint of fever, sore throat, bilateral ear pain and congestion x3 days.  Endorses slight cough.  Denies chest pain, shortness of breath, abdominal pain, nausea, vomiting, diarrhea or rash. ?  ? ? ?Past Medical History  ? ?Past Medical History:  ?Diagnosis Date  ? Medical history non-contributory   ? ? ? ?Active Problem List  ? ?Patient Active Problem List  ? Diagnosis Date Noted  ? Indication for care in labor and delivery, antepartum 01/09/2018  ? Indication for care or intervention in labor or delivery 01/09/2018  ? Labor and delivery, indication for care 11/22/2016  ? ? ? ?Past Surgical History  ? ?Past Surgical History:  ?Procedure Laterality Date  ? NO PAST SURGERIES    ? ? ? ?Home Medications  ? ?Prior to Admission medications   ?Medication Sig Start Date End Date Taking? Authorizing Provider  ?amoxicillin-clavulanate (AUGMENTIN) 875-125 MG tablet Take 1 tablet by mouth 2 (two) times daily. 07/19/21  Yes Irean Hong, MD  ?magic mouthwash SOLN 89mL Anbesol ?37mL Benadryl ?25mL Mylanta ? ?17mL swish, gargle & spit q8hr prn throat discomfort 07/19/21  Yes Irean Hong, MD  ?acetaminophen (TYLENOL) 325 MG tablet Take 2 tablets (650 mg total) by mouth every 4 (four) hours as needed (for pain scale < 4). 01/11/18   McVey, Prudencio Pair, CNM  ?ibuprofen (ADVIL,MOTRIN) 600 MG tablet Take 1 tablet (600 mg total) by mouth every 6 (six) hours. 01/11/18   McVey, Prudencio Pair, CNM  ?Prenatal Vit-Fe Fumarate-FA (MULTIVITAMIN-PRENATAL) 27-0.8 MG TABS tablet Take 1 tablet by mouth daily at 12 noon.    [provider]  ?senna-docusate (SENOKOT-S) 8.6-50 MG tablet Take 2 tablets by mouth at bedtime. 01/11/18   McVey, Prudencio Pair, CNM  ? ? ? ?Allergies   ?Patient has no known allergies. ? ? ?Family History  ?No family history on file. ? ? ?Physical Exam  ?Triage Vital Signs: ?ED Triage Vitals [07/19/21 0051]  ?Enc Vitals Group  ?   BP   ?   Pulse   ?   Resp   ?   Temp   ?   Temp src   ?   SpO2   ?   Weight 160 lb (72.6 kg)  ?   Height 5\' 3"  (1.6 m)  ?   Head Circumference   ?   Peak Flow   ?   Pain Score 9  ?   Pain Loc   ?   Pain Edu?   ?   Excl. in GC?   ? ? ?Updated Vital Signs: ?BP 114/76 (BP Location: Left Arm)   Pulse 79   Temp 100.3 ?F (37.9 ?C)   Resp 19   Ht 5\' 3"  (1.6 m)   Wt 72.6 kg   LMP 06/19/2021 (Approximate)   SpO2 99%   BMI 28.34 kg/m?  ? ? ?General: Awake, no distress.  ?CV:  RRR.  Good peripheral perfusion.  ?Resp:  Normal effort.  CTA B. ?Abd:  Nontender.  No distention.  ?Other:  Bilateral TMs dull with mild fluid.  Nasal congestion noted.  Moderately erythematous oropharynx with symmetrical bilateral tonsillar swelling and exudates.  No peritonsillar abscess.  There is no hoarse or muffled voice.  There is no drooling.  Shotty anterior lymphadenopathy.  Supple neck without meningismus.  No petechiae. ? ? ?ED Results / Procedures / Treatments  ?Labs ?(all labs ordered are listed, but only abnormal results are displayed) ?Labs Reviewed  ?GROUP A STREP BY PCR  ?RESP PANEL BY RT-PCR (FLU A&B, COVID) [id number]  ? ? ? ?EKG ? ?None ? ? ?RADIOLOGY ?None ? ? ?Official radiology report(s): ?No results found. ? ? ?PROCEDURES: ? ?Critical Care performed: No ? ?Procedures ? ? ?MEDICATIONS ORDERED IN ED: ?Medications  ?dexamethasone (DECADRON) injection 10 mg (10 mg Intramuscular Given 07/19/21 0148)  ?amoxicillin-clavulanate (AUGMENTIN) 875-125 MG per tablet 1 tablet (1 tablet Oral Given 07/19/21 0148)  ?ibuprofen (ADVIL) tablet 600 mg (600 mg Oral Given 07/19/21 0148)  ?magic mouthwash (10 mLs Oral Given 07/19/21 0152)  ? ? ? ?IMPRESSION / MDM / ASSESSMENT AND PLAN / ED COURSE  ?I reviewed the triage vital signs and the nursing notes. ?             ?                ?31 year old female presenting with a 3-day history of sore throat, congestion and fever.  Awaiting results of respiratory panel and group A strep.  Will administer IM Decadron, Magic mouthwash, start Augmentin.  Will reassess. ? ?0208 ?Respiratory panel and group A strep are negative. Will discharge home with prescription for Augmentin and Magic mouthwash. Patient will follow up with her PCP. Strict return precautions given. Patient verbalizes understanding and agrees with plan of care. ? ?FINAL CLINICAL IMPRESSION(S) / ED DIAGNOSES  ? ?Final diagnoses:  ?Sore throat  ?Tonsillitis  ? ? ? ?Rx / DC Orders  ? ?ED Discharge Orders   ? ?      Ordered  ?  amoxicillin-clavulanate (AUGMENTIN) 875-125 MG tablet  2 times daily       ? 07/19/21 0208  ?  magic mouthwash SOLN       ? 07/19/21 0208  ? ?  ?  ? ?  ? ? ? ?Note:  This document was prepared using Dragon voice recognition software and may include unintentional dictation errors. ?  ?Irean Hong, MD ?07/19/21 0230 ? ?

## 2021-07-19 NOTE — ED Triage Notes (Signed)
Patient ambulatory to triage with steady gait, without difficulty or distress noted; pt reports fever & sore throat x 3 days ?

## 2021-07-19 NOTE — Discharge Instructions (Signed)
1.  Take antibiotic as prescribed (Augmentin 875 mg twice daily x7 days). °2.  You may take Magic mouthwash as needed for throat discomfort. °3.  Return to the ER for worsening symptoms, persistent vomiting, difficulty breathing or other concerns. °

## 2021-12-21 ENCOUNTER — Other Ambulatory Visit: Payer: Self-pay

## 2021-12-21 ENCOUNTER — Emergency Department: Payer: Self-pay

## 2021-12-21 DIAGNOSIS — R0602 Shortness of breath: Secondary | ICD-10-CM | POA: Insufficient documentation

## 2021-12-21 DIAGNOSIS — R531 Weakness: Secondary | ICD-10-CM | POA: Insufficient documentation

## 2021-12-21 DIAGNOSIS — Z5321 Procedure and treatment not carried out due to patient leaving prior to being seen by health care provider: Secondary | ICD-10-CM | POA: Insufficient documentation

## 2021-12-21 LAB — COMPREHENSIVE METABOLIC PANEL
ALT: 12 U/L (ref 0–44)
AST: 16 U/L (ref 15–41)
Albumin: 2.9 g/dL — ABNORMAL LOW (ref 3.5–5.0)
Alkaline Phosphatase: 58 U/L (ref 38–126)
Anion gap: 4 — ABNORMAL LOW (ref 5–15)
BUN: 11 mg/dL (ref 6–20)
CO2: 22 mmol/L (ref 22–32)
Calcium: 8.8 mg/dL — ABNORMAL LOW (ref 8.9–10.3)
Chloride: 112 mmol/L — ABNORMAL HIGH (ref 98–111)
Creatinine, Ser: 0.76 mg/dL (ref 0.44–1.00)
GFR, Estimated: >60 mL/min (ref >60)
Glucose, Bld: 122 mg/dL — ABNORMAL HIGH (ref 70–99)
Potassium: 3.5 mmol/L (ref 3.5–5.1)
Sodium: 138 mmol/L (ref 135–145)
Total Bilirubin: 0.5 mg/dL (ref 0.3–1.2)
Total Protein: 6.5 g/dL (ref 6.5–8.1)

## 2021-12-21 LAB — CBC
HCT: 35.7 % — ABNORMAL LOW (ref 36.0–46.0)
Hemoglobin: 12.3 g/dL (ref 12.0–15.0)
MCH: 32.1 pg (ref 26.0–34.0)
MCHC: 34.5 g/dL (ref 30.0–36.0)
MCV: 93.2 fL (ref 80.0–100.0)
Platelets: 180 10*3/uL (ref 150–400)
RBC: 3.83 MIL/uL — ABNORMAL LOW (ref 3.87–5.11)
RDW: 13.1 % (ref 11.5–15.5)
WBC: 9.8 10*3/uL (ref 4.0–10.5)
nRBC: 0 % (ref 0.0–0.2)

## 2021-12-21 LAB — URINALYSIS, ROUTINE W REFLEX MICROSCOPIC
Bilirubin Urine: NEGATIVE
Glucose, UA: NEGATIVE mg/dL
Hgb urine dipstick: NEGATIVE
Ketones, ur: NEGATIVE mg/dL
Nitrite: NEGATIVE
Protein, ur: NEGATIVE mg/dL
Specific Gravity, Urine: 1.023 (ref 1.005–1.030)
pH: 6 (ref 5.0–8.0)

## 2021-12-21 LAB — POC URINE PREG, ED: Preg Test, Ur: POSITIVE — AB

## 2021-12-21 NOTE — ED Triage Notes (Signed)
Pt presents to ER with c/o sob and weakness since yesterday.  Pt also states she took her BP at home and it was 90/60.  Pt denies any cough, body aches, chills or  fever at home and has not been around anyone sick.  Pt ambulatory to triage with steady gait and is A&O x4 at this time.

## 2021-12-22 ENCOUNTER — Emergency Department
Admission: EM | Admit: 2021-12-22 | Discharge: 2021-12-22 | Discharge status: Against Medical Advice | Payer: Self-pay | Attending: Emergency Medicine | Admitting: Emergency Medicine

## 2021-12-22 ENCOUNTER — Telehealth: Payer: Self-pay | Admitting: Emergency Medicine

## 2021-12-22 LAB — HCG, QUANTITATIVE, PREGNANCY: hCG, Beta Chain, Quant, S: 10812 m[IU]/mL — ABNORMAL HIGH (ref <5)

## 2021-12-22 NOTE — Telephone Encounter (Signed)
Called patient due to left emergency department before provider exam to inquire about condition and follow up plans. She was not aware of positive pregnancy test.  I told her that she can always return here, and that she should definitely follow up with her doctor as well.

## 2022-04-10 ENCOUNTER — Other Ambulatory Visit: Payer: Self-pay

## 2022-04-10 ENCOUNTER — Encounter: Payer: Self-pay | Admitting: Emergency Medicine

## 2022-04-10 DIAGNOSIS — Z3A36 36 weeks gestation of pregnancy: Secondary | ICD-10-CM

## 2022-04-10 DIAGNOSIS — O42913 Preterm premature rupture of membranes, unspecified as to length of time between rupture and onset of labor, third trimester: Principal | ICD-10-CM | POA: Diagnosis present

## 2022-04-10 NOTE — ED Triage Notes (Signed)
Pt states she thinks she is about [redacted] weeks pregnant, having lower abd pain and vaginal bleeding that began today. Pt does not know her last mp and has not had any prenatal care. Pt appears in no acute distress. Pt states has used one pad and is bleeding in pink in color.

## 2022-04-11 ENCOUNTER — Inpatient Hospital Stay
Admission: EM | Admit: 2022-04-11 | Discharge: 2022-04-12 | DRG: 805 | Disposition: A | Discharge status: Home / Self Care | Payer: Medicaid Other | Attending: Advanced Practice Midwife | Admitting: Advanced Practice Midwife

## 2022-04-11 ENCOUNTER — Encounter: Payer: Self-pay | Admitting: Emergency Medicine

## 2022-04-11 ENCOUNTER — Inpatient Hospital Stay: Payer: Medicaid Other | Admitting: Anesthesiology

## 2022-04-11 ENCOUNTER — Observation Stay: Payer: Medicaid Other

## 2022-04-11 ENCOUNTER — Other Ambulatory Visit: Payer: Self-pay

## 2022-04-11 DIAGNOSIS — Z3A36 36 weeks gestation of pregnancy: Secondary | ICD-10-CM

## 2022-04-11 DIAGNOSIS — O42913 Preterm premature rupture of membranes, unspecified as to length of time between rupture and onset of labor, third trimester: Secondary | ICD-10-CM | POA: Diagnosis present

## 2022-04-11 DIAGNOSIS — O42919 Preterm premature rupture of membranes, unspecified as to length of time between rupture and onset of labor, unspecified trimester: Secondary | ICD-10-CM | POA: Insufficient documentation

## 2022-04-11 DIAGNOSIS — O42013 Preterm premature rupture of membranes, onset of labor within 24 hours of rupture, third trimester: Secondary | ICD-10-CM

## 2022-04-11 DIAGNOSIS — O0933 Supervision of pregnancy with insufficient antenatal care, third trimester: Secondary | ICD-10-CM

## 2022-04-11 LAB — RPR: RPR Ser Ql: NONREACTIVE

## 2022-04-11 LAB — URINALYSIS, ROUTINE W REFLEX MICROSCOPIC
Bilirubin Urine: NEGATIVE
Glucose, UA: NEGATIVE mg/dL
Ketones, ur: NEGATIVE mg/dL
Nitrite: NEGATIVE
Protein, ur: 30 mg/dL — AB
Specific Gravity, Urine: 1.012 (ref 1.005–1.030)
pH: 6 (ref 5.0–8.0)

## 2022-04-11 LAB — CBC
HCT: 34.1 % — ABNORMAL LOW (ref 36.0–46.0)
Hemoglobin: 11.3 g/dL — ABNORMAL LOW (ref 12.0–15.0)
MCH: 29 pg (ref 26.0–34.0)
MCHC: 33.1 g/dL (ref 30.0–36.0)
MCV: 87.4 fL (ref 80.0–100.0)
Platelets: 174 10*3/uL (ref 150–400)
RBC: 3.9 MIL/uL (ref 3.87–5.11)
RDW: 13.1 % (ref 11.5–15.5)
WBC: 6.7 10*3/uL (ref 4.0–10.5)
nRBC: 0 % (ref 0.0–0.2)

## 2022-04-11 LAB — URINE DRUG SCREEN, QUALITATIVE (ARMC ONLY)
Amphetamines, Ur Screen: NOT DETECTED
Barbiturates, Ur Screen: NOT DETECTED
Benzodiazepine, Ur Scrn: NOT DETECTED
Cannabinoid 50 Ng, Ur ~~LOC~~: NOT DETECTED
Cocaine Metabolite,Ur ~~LOC~~: NOT DETECTED
MDMA (Ecstasy)Ur Screen: NOT DETECTED
Methadone Scn, Ur: NOT DETECTED
Opiate, Ur Screen: NOT DETECTED
Phencyclidine (PCP) Ur S: NOT DETECTED
Tricyclic, Ur Screen: NOT DETECTED

## 2022-04-11 LAB — RAPID HIV SCREEN (HIV 1/2 AB+AG)
HIV 1/2 Antibodies: NONREACTIVE
HIV-1 P24 Antigen - HIV24: NONREACTIVE

## 2022-04-11 LAB — CHLAMYDIA/NGC RT PCR (ARMC ONLY)
Chlamydia Tr: NOT DETECTED
N gonorrhoeae: NOT DETECTED

## 2022-04-11 LAB — ABO/RH: ABO/RH(D): O POS

## 2022-04-11 LAB — WET PREP, GENITAL
Clue Cells Wet Prep HPF POC: NONE SEEN
Sperm: NONE SEEN
Trich, Wet Prep: NONE SEEN
WBC, Wet Prep HPF POC: NONE SEEN — AB (ref <10)
Yeast Wet Prep HPF POC: NONE SEEN

## 2022-04-11 LAB — FETAL FIBRONECTIN: Fetal Fibronectin: POSITIVE — AB

## 2022-04-11 LAB — TYPE AND SCREEN
ABO/RH(D): O POS
Antibody Screen: NEGATIVE

## 2022-04-11 LAB — RUPTURE OF MEMBRANE (ROM)PLUS: Rom Plus: POSITIVE

## 2022-04-11 LAB — GROUP B STREP BY PCR: Group B strep by PCR: NEGATIVE

## 2022-04-11 LAB — HCG, QUANTITATIVE, PREGNANCY: hCG, Beta Chain, Quant, S: 6451 m[IU]/mL — ABNORMAL HIGH (ref <5)

## 2022-04-11 LAB — HEPATITIS B SURFACE ANTIGEN: Hepatitis B Surface Ag: NONREACTIVE

## 2022-04-11 MED ORDER — COCONUT OIL OIL: 1.0000 | TOPICAL_OIL | Status: DC | PRN

## 2022-04-11 MED ORDER — SIMETHICONE 80 MG PO CHEW: 80.0000 mg | CHEWABLE_TABLET | ORAL | Status: DC | PRN

## 2022-04-11 MED ORDER — BENZOCAINE-MENTHOL 20-0.5 % EX AERO: 1.0000 | INHALATION_SPRAY | CUTANEOUS | Status: DC | PRN

## 2022-04-11 MED ORDER — SODIUM CHLORIDE 0.9 % IV SOLN: 1.0000 g | INTRAVENOUS | Status: DC

## 2022-04-11 MED ORDER — AMMONIA AROMATIC IN INHA
RESPIRATORY_TRACT | Status: AC
  Filled 2022-04-11: qty 10

## 2022-04-11 MED ORDER — OXYTOCIN BOLUS FROM INFUSION
333.0000 mL | Freq: Once | INTRAVENOUS | Status: AC
  Administered 2022-04-11: 333 mL via INTRAVENOUS

## 2022-04-11 MED ORDER — TETANUS-DIPHTH-ACELL PERTUSSIS 5-2.5-18.5 LF-MCG/0.5 IM SUSY
0.5000 mL | PREFILLED_SYRINGE | Freq: Once | INTRAMUSCULAR | Status: DC
  Filled 2022-04-11: qty 0.5

## 2022-04-11 MED ORDER — TERBUTALINE SULFATE 1 MG/ML IJ SOLN: 0.2500 mg | Freq: Once | INTRAMUSCULAR | Status: DC | PRN

## 2022-04-11 MED ORDER — NIFEDIPINE 10 MG PO CAPS: 20.0000 mg | ORAL_CAPSULE | Freq: Three times a day (TID) | ORAL | Status: DC

## 2022-04-11 MED ORDER — LACTATED RINGERS IV SOLN: INTRAVENOUS | Status: DC

## 2022-04-11 MED ORDER — SENNOSIDES-DOCUSATE SODIUM 8.6-50 MG PO TABS
2.0000 | ORAL_TABLET | Freq: Every day | ORAL | Status: DC
  Administered 2022-04-12: 2 via ORAL
  Filled 2022-04-11: qty 2

## 2022-04-11 MED ORDER — LIDOCAINE-EPINEPHRINE (PF) 1.5 %-1:200000 IJ SOLN
INTRAMUSCULAR | Status: DC | PRN
  Administered 2022-04-11: 3 mL via EPIDURAL

## 2022-04-11 MED ORDER — ONDANSETRON HCL 4 MG/2ML IJ SOLN: 4.0000 mg | INTRAMUSCULAR | Status: DC | PRN

## 2022-04-11 MED ORDER — LACTATED RINGERS IV SOLN: 500.0000 mL | INTRAVENOUS | Status: DC | PRN

## 2022-04-11 MED ORDER — EPHEDRINE 5 MG/ML INJ: 10.0000 mg | INTRAVENOUS | Status: DC | PRN

## 2022-04-11 MED ORDER — ONDANSETRON HCL 4 MG/2ML IJ SOLN: 4.0000 mg | Freq: Four times a day (QID) | INTRAMUSCULAR | Status: DC | PRN

## 2022-04-11 MED ORDER — OXYTOCIN-SODIUM CHLORIDE 30-0.9 UT/500ML-% IV SOLN
2.5000 [IU]/h | INTRAVENOUS | Status: DC
  Administered 2022-04-11: 2.5 [IU]/h via INTRAVENOUS
  Filled 2022-04-11: qty 500

## 2022-04-11 MED ORDER — LIDOCAINE HCL (PF) 1 % IJ SOLN
INTRAMUSCULAR | Status: DC | PRN
  Administered 2022-04-11: 3 mL via SUBCUTANEOUS

## 2022-04-11 MED ORDER — IBUPROFEN 600 MG PO TABS
600.0000 mg | ORAL_TABLET | Freq: Four times a day (QID) | ORAL | Status: DC
  Administered 2022-04-12 (×3): 600 mg via ORAL
  Filled 2022-04-11 (×3): qty 1

## 2022-04-11 MED ORDER — BETAMETHASONE SOD PHOS & ACET 6 (3-3) MG/ML IJ SUSP
INTRAMUSCULAR | Status: AC
  Filled 2022-04-11: qty 5

## 2022-04-11 MED ORDER — FENTANYL-BUPIVACAINE-NACL 0.5-0.125-0.9 MG/250ML-% EP SOLN
12.0000 mL/h | EPIDURAL | Status: DC | PRN
  Administered 2022-04-11: 12 mL/h via EPIDURAL

## 2022-04-11 MED ORDER — LACTATED RINGERS IV SOLN
500.0000 mL | Freq: Once | INTRAVENOUS | Status: AC
  Administered 2022-04-11: 500 mL via INTRAVENOUS

## 2022-04-11 MED ORDER — SOD CITRATE-CITRIC ACID 500-334 MG/5ML PO SOLN: 30.0000 mL | ORAL | Status: DC | PRN

## 2022-04-11 MED ORDER — NIFEDIPINE 10 MG PO CAPS: 30.0000 mg | ORAL_CAPSULE | Freq: Three times a day (TID) | ORAL | Status: DC

## 2022-04-11 MED ORDER — NIFEDIPINE 10 MG PO CAPS
30.0000 mg | ORAL_CAPSULE | Freq: Three times a day (TID) | ORAL | Status: AC
  Administered 2022-04-11: 30 mg via ORAL
  Filled 2022-04-11: qty 3

## 2022-04-11 MED ORDER — PHENYLEPHRINE 80 MCG/ML (10ML) SYRINGE FOR IV PUSH (FOR BLOOD PRESSURE SUPPORT): 80.0000 ug | PREFILLED_SYRINGE | INTRAVENOUS | Status: DC | PRN

## 2022-04-11 MED ORDER — BETAMETHASONE SOD PHOS & ACET 6 (3-3) MG/ML IJ SUSP
12.0000 mg | INTRAMUSCULAR | Status: DC
  Administered 2022-04-11: 12 mg via INTRAMUSCULAR
  Filled 2022-04-11: qty 2

## 2022-04-11 MED ORDER — PRENATAL MULTIVITAMIN CH
1.0000 | ORAL_TABLET | Freq: Every day | ORAL | Status: DC
  Administered 2022-04-12: 1 via ORAL
  Filled 2022-04-11: qty 1

## 2022-04-11 MED ORDER — LIDOCAINE HCL (PF) 1 % IJ SOLN
30.0000 mL | INTRAMUSCULAR | Status: DC | PRN
  Filled 2022-04-11: qty 30

## 2022-04-11 MED ORDER — MISOPROSTOL 200 MCG PO TABS
ORAL_TABLET | ORAL | Status: AC
  Filled 2022-04-11: qty 4

## 2022-04-11 MED ORDER — ACETAMINOPHEN 325 MG PO TABS: 650.0000 mg | ORAL_TABLET | ORAL | Status: DC | PRN

## 2022-04-11 MED ORDER — HYDROXYZINE HCL 25 MG PO TABS: 50.0000 mg | ORAL_TABLET | Freq: Four times a day (QID) | ORAL | Status: DC | PRN

## 2022-04-11 MED ORDER — OXYTOCIN-SODIUM CHLORIDE 30-0.9 UT/500ML-% IV SOLN
1.0000 m[IU]/min | INTRAVENOUS | Status: DC
  Administered 2022-04-11 (×2): 2 m[IU]/min via INTRAVENOUS

## 2022-04-11 MED ORDER — DIBUCAINE (PERIANAL) 1 % EX OINT: 1.0000 | TOPICAL_OINTMENT | CUTANEOUS | Status: DC | PRN

## 2022-04-11 MED ORDER — OXYTOCIN 10 UNIT/ML IJ SOLN
INTRAMUSCULAR | Status: AC
  Filled 2022-04-11: qty 2

## 2022-04-11 MED ORDER — SODIUM CHLORIDE 0.9 % IV SOLN
2.0000 g | Freq: Once | INTRAVENOUS | Status: AC
  Administered 2022-04-11: 2 g via INTRAVENOUS
  Filled 2022-04-11: qty 2000

## 2022-04-11 MED ORDER — BUPIVACAINE HCL (PF) 0.25 % IJ SOLN
INTRAMUSCULAR | Status: DC | PRN
  Administered 2022-04-11: 3 mL via EPIDURAL
  Administered 2022-04-11: 4 mL via EPIDURAL

## 2022-04-11 MED ORDER — ONDANSETRON HCL 4 MG PO TABS: 4.0000 mg | ORAL_TABLET | ORAL | Status: DC | PRN

## 2022-04-11 MED ORDER — WITCH HAZEL-GLYCERIN EX PADS: 1.0000 | MEDICATED_PAD | CUTANEOUS | Status: DC | PRN

## 2022-04-11 MED ORDER — DIPHENHYDRAMINE HCL 50 MG/ML IJ SOLN: 12.5000 mg | INTRAMUSCULAR | Status: DC | PRN

## 2022-04-11 MED ORDER — DIPHENHYDRAMINE HCL 25 MG PO CAPS: 25.0000 mg | ORAL_CAPSULE | Freq: Four times a day (QID) | ORAL | Status: DC | PRN

## 2022-04-11 MED ORDER — FENTANYL-BUPIVACAINE-NACL 0.5-0.125-0.9 MG/250ML-% EP SOLN
EPIDURAL | Status: AC
  Filled 2022-04-11: qty 250

## 2022-04-11 MED ORDER — ACETAMINOPHEN 325 MG PO TABS
650.0000 mg | ORAL_TABLET | ORAL | Status: DC | PRN
  Administered 2022-04-11 (×2): 650 mg via ORAL
  Filled 2022-04-11 (×2): qty 2

## 2022-04-11 MED ORDER — FENTANYL CITRATE (PF) 100 MCG/2ML IJ SOLN: 50.0000 ug | INTRAMUSCULAR | Status: DC | PRN

## 2022-04-11 NOTE — Progress Notes (Signed)
LABOR NOTE   SUBJECTIVE:   Carrie Wagner is a 31 y.o.  [phone] with unknown dates and no prenatal care who presented to L&D with PPROM and contractions. US shows a GA of [redacted]w[redacted]d. She received a dose of betamethasone and nifedipine while awaiting [place].    Analgesia: Labor support without medications and plans epidural in active labor  OBJECTIVE:  BP 120/71   Pulse 69   Temp 98.1 F (36.7 C) (Oral)   Resp 18   Ht 5\' 3"  (1.6 m)   Wt 72.6 kg   LMP  (LMP Unknown)   SpO2 99%   BMI 28.34 kg/m  No intake/output data recorded.  SVE:   Dilation: 3 Effacement (%): 40 Station: Ballotable Exam by:: [email or web address], CNM CONTRACTIONS: irregular FHR: Fetal heart tracing reviewed. Baseline: 120 Variability: moderate Accelerations: present Decelerations:none Category 1  Labs: Lab Results  Component Value Date   WBC 6.7 04/10/2022   HGB 11.3 (L) 04/10/2022   HCT 34.1 (L) 04/10/2022   MCV 87.4 04/10/2022   PLT 174 04/10/2022    ASSESSMENT: 1)  PPROM ,      Coping: well     Membranes: ruptured, clear fluid   Principal Problem:   Labor and delivery, indication for care Active Problems:   Preterm premature rupture of membranes   PLAN: Expectant management Discussed plan with Dr. 14/03/2022 Anticipate NSVD  Valentino Saxon, CNM 04/11/2022 3:12 AM

## 2022-04-11 NOTE — Anesthesia Procedure Notes (Signed)
Epidural Patient location during procedure: OB Start time: 04/11/2022 1:47 PM End time: 04/11/2022 2:00 PM  Staffing Anesthesiologist: Louie Boston, MD Resident/CRNA: Karoline Caldwell, CRNA Performed: anesthesiologist   Preanesthetic Checklist Completed: patient identified, IV checked, site marked, risks and benefits discussed, surgical consent, monitors and equipment checked, pre-op evaluation and timeout performed  Epidural Patient position: sitting Prep: ChloraPrep Patient monitoring: heart rate, continuous pulse ox and blood pressure Approach: midline Location: L3-L4 Injection technique: LOR saline  Needle:  Needle type: Tuohy  Needle gauge: 17 G Needle length: 9 cm and 9 Needle insertion depth: 5 cm Catheter type: closed end flexible Catheter size: 19 Gauge Catheter at skin depth: 11 cm Test dose: negative and 1.5% lidocaine with Epi 1:200 K  Assessment Events: blood not aspirated, no cerebrospinal fluid, injection not painful, no injection resistance, no paresthesia and negative IV test  Additional Notes 1 attempt Pt. Evaluated and documentation done after procedure finished. Patient identified. Risks/Benefits/Options discussed with patient including but not limited to bleeding, infection, nerve damage, paralysis, failed block, incomplete pain control, headache, blood pressure changes, nausea, vomiting, reactions to medication both or allergic, itching and postpartum back pain. Confirmed with bedside nurse the patient's most recent platelet count. Confirmed with patient that they are not currently taking any anticoagulation, have any bleeding history or any family history of bleeding disorders. Patient expressed understanding and wished to proceed. All questions were answered. Sterile technique was used throughout the entire procedure. Please see nursing notes for vital signs. Test dose was given through epidural catheter and negative prior to continuing to dose epidural or  start infusion. Warning signs of high block given to the patient including shortness of breath, tingling/numbness in hands, complete motor block, or any concerning symptoms with instructions to call for help. Patient was given instructions on fall risk and not to get out of bed. All questions and concerns addressed with instructions to call with any issues or inadequate analgesia.    Patient tolerated the insertion well without immediate complications.Reason for block:procedure for pain

## 2022-04-11 NOTE — Progress Notes (Signed)
LABOR NOTE   SUBJECTIVE:   Carrie Wagner is a 31 y.o.  [phone] at [redacted]w[redacted]d who presented to L&D with contractions and PPROM. She received a dose of betamethasone and Procardia in triage while awaiting her dating [place]. Her contractions spaced out significantly. Discussed plan with Dr. Logan Bores, who recommends augmentation of labor. Carrie Wagner is in agreement with this plan.    Analgesia:  Desires epidural  OBJECTIVE:  BP (!) 88/56 (BP Location: Left Arm) Comment: pt sleepin on left side, asymotopmatic, reassessing,  Pulse 66   Temp 98.1 F (36.7 C) (Oral)   Resp 16   Ht 5\' 3"  (1.6 m)   Wt 72.6 kg   LMP  (LMP Unknown)   SpO2 99%   BMI 28.34 kg/m  No intake/output data recorded.  SVE:   Dilation: 3 Effacement (%): 40 Station: Ballotable Exam by:: [email or web address], CNM CONTRACTIONS: irregular FHR: Fetal heart tracing reviewed. Baseline: 120 Variability: moderate Accelerations: present Decelerations:none Category 1  Labs: Lab Results  Component Value Date   WBC 6.7 04/10/2022   HGB 11.3 (L) 04/10/2022   HCT 34.1 (L) 04/10/2022   MCV 87.4 04/10/2022   PLT 174 04/10/2022    ASSESSMENT: 1)  PPROM     Coping: well     Membranes: ruptured, clear fluid     Reassuring maternal/fetal status   Principal Problem:   Labor and delivery, indication for care Active Problems:   Preterm premature rupture of membranes   PLAN: Carrie Wagner will have breakfast and shower and then start Pitocin Desires epidural Anticipate NSVD Dr. Byrd Hesselbach updated  Logan Bores, CNM 04/11/2022 8:21 AM

## 2022-04-11 NOTE — ED Notes (Signed)
Dr. Modesto Charon notified of pt and in triage to assess, ultrasound being performed by md currently, md states pt will need to go to ob.

## 2022-04-11 NOTE — OB Triage Note (Signed)
Pt arrives to labor and delivery as a G6 P3 and sees [facility] for her care. Pt states that she started bleeding around 2200 with cramping starting earlier.

## 2022-04-11 NOTE — Progress Notes (Signed)
Pt. Resting comfortably in bed. Reports feeling occasional mild contractions. According to [facility], plan is to allow patient to eat breakfast and shower before starting pitocin to augment labor. Pt ordering breakfast now. Plans to start pitocin around 1000 and waiting on her support persons arrival.

## 2022-04-11 NOTE — Discharge Summary (Signed)
OB Discharge Summary     Patient Name: Carrie Wagner DOB: 1990/08/01 MRN: [id number]  Date of admission: 04/11/2022 Delivering provider: Tresea Mall, CNM  Date of Delivery: 04/11/2022  Date of discharge: 04/12/2022   Admitting diagnosis: Labor and delivery, indication for care [O75.9] Intrauterine pregnancy: [redacted]w[redacted]d     Secondary diagnosis:  no prenatal care     Discharge diagnosis: Term Pregnancy Delivered                                                                                                Post partum procedures:  none  Augmentation: Pitocin  Complications: None  Hospital course:  Induction of Labor With Vaginal Delivery   31 y.o. yo [phone] at [redacted]w[redacted]d was admitted to the hospital 04/11/2022 for induction of labor.  Indication for induction: PROM.  Patient had an labor course complicated by NA Membrane Rupture Time/Date:04/10/2022 Delivery Method:Vaginal, Spontaneous  Episiotomy: None  Lacerations:  None  Details of delivery can be found in separate delivery note.    Patient had a postpartum course uncomplicated.  Carrie Wagner had a NSVB on 04/11/2022. Her labor was uncomplicated. Has had routine postpartum care.  Pt. Is eating, hydrating, and voiding regularly without difficulty. Has yet to have BM. She is breastfeeding and bottle feeding without difficulty. Reports mild vaginal bleeding, denies passing large blood clots. Has had cramping abdomen pain relieved with ibuprofen. Plans to use nexplanon for contraception. Denies anxiety/depression symptoms. Endorses good support from partner and family.     Patient is discharged home 04/12/2022  Newborn Data: Birth date:04/11/2022  Birth time:6:14 PM  Gender:Female  Living status:Living  Apgars:8 ,9  Weight:3200 g  7 pounds 1 ounce  Physical exam  Vitals:   04/11/22 2250 04/12/22 0300 04/12/22 0756 04/12/22 1230  BP: 106/76 94/60 98/72  97/72  Pulse: 76 69 (!) 56 71  Resp: 17 20 17 17    Temp: 97.8 F (36.6 C) 98.4 F (36.9 C) 98.2 F (36.8 C) 98.4 F (36.9 C)  TempSrc: Axillary Axillary Oral Oral  SpO2: 98% 97% 98% 99%  Weight:      Height:       General: alert, cooperative, and no distress Lochia: appropriate Uterine Fundus: firm, ML, @U  Perineum: no erythema  DVT Evaluation: No evidence of DVT seen on physical exam. Negative Homan's sign. No cords or calf tenderness. No significant calf/ankle edema.  Labs: Lab Results  Component Value Date   WBC 13.0 (H) 04/12/2022   HGB 9.7 (L) 04/12/2022   HCT 29.6 (L) 04/12/2022   MCV 87.1 04/12/2022   PLT 174 04/12/2022    Discharge instruction: per After Visit Summary. Counseled on normal uterine involution and vaginal bleeding postpartum Discussed danger signs including s/sx of infection/excessive bleeding or uncontrolled pain Encouraged to rest when possible and stay well hydrated Encouraged to continue to breastfeed, discussed latching, position changes, cluster feeding, hunger cues, lactogensis II, milk supply, sore nipple management Addressed laceration healing and perineal cleaning with peri bottle Counseled on postpartum anxiety and depression s/sx, when to contact clinic/seek mental health support Pt understands return to fertility, reviewed [facility]  use/effectiveness/benefits/side effects/risks Advised when to resume increased physical activity, using tampons, intercourse Fe for anemia Recommend taking ibuprofen and/or tylenol for pain     Medications:  Allergies as of 04/12/2022   No Known Allergies      Medication List     STOP taking these medications    acetaminophen 325 MG tablet Commonly known as: Tylenol   amoxicillin-clavulanate 875-125 MG tablet Commonly known as: Augmentin   ibuprofen 600 MG tablet Commonly known as: ADVIL   magic mouthwash Soln   senna-docusate 8.6-50 MG tablet Commonly known as: Senokot-S       TAKE these medications    multivitamin-prenatal 27-0.8 MG  Tabs tablet Take 1 tablet by mouth daily at 12 noon.        Diet: routine diet  Activity: Advance as tolerated. Pelvic rest for 6 weeks.   Outpatient follow up:  Follow-up Information     [facility]. Schedule an appointment as soon as possible for a visit.   Why: 2 weeks telephone visit and 6 weeks in office postpartum visit Contact information: [address] [place] [address] [phone]        [facility] Follow up in 6 week(s).   Specialty: General Practice Contact information: [address] [place] [address] [phone]                   Postpartum contraception: Nexplanon Rhogam Given postpartum: Rh positive Rubella vaccine given postpartum: Immune (2018) Varicella vaccine given postpartum: Non-immune (2018) TDaP given antepartum or postpartum: to be offered before discharge    Newborn Delivery   Birth date/time: 04/11/2022 18:14:00 Delivery type: Vaginal, Spontaneous       Baby Feeding:  breast and formula  Disposition:home with mother  SIGNED:  Raeford Razor, CNM 04/12/2022 2:10 PM

## 2022-04-11 NOTE — Progress Notes (Signed)
IV team at bedside 

## 2022-04-11 NOTE — Progress Notes (Signed)
Patient is s/p epidural and not yet completely comfortable. Cervical exam prior to epidural 4/80/-2 per RN.   BP (!) 109/58   Pulse 73   Temp 98.2 F (36.8 C) (Oral)   Resp 18   Ht 5\' 3"  (1.6 m)   Wt 72.6 kg   LMP  (LMP Unknown)   SpO2 96%   BMI 28.34 kg/m   Toco: every 2-4 minutes Fetal well being: 115 bpm, moderate variability, +accelerations, -decelerations  Continue pitocin titration Expectant management   , CNM

## 2022-04-11 NOTE — H&P (Cosign Needed Addendum)
History and Physical   HPI  Carrie Wagner is a 31 y.o. [id number] at Unknown Estimated Date of Delivery: None noted. who is being admitted for Preterm labor and PPROM. She has not had any prenatal care this pregnancy. She is unsure when her last period was but believes it was in the summer and that she is about [redacted] weeks pregnant. She has a h/o irregular periods. She reports that she has been feeling fetal movement for about 3 months. She reports that she noticed pain and pressure around 1600 yesterday. She noticed pinkish bleeding and a mucusy discharge around 2200. She reports when she went to the bathroom, she felt fluid coming out. She states that she last had IC a few months ago. She had a busy day at work Producer, television/film/video at [employer]). She denies any falls or abdominal trauma. She does report a headache. She denies tobacco and recreational drug use. Fundal height is 32. Speculum exam shows that her membranes are grossly ruptured for clear fluid. Awaiting ultrasound for dating. Prenatal labs collected.  OB History  OB History  Gravida Para Term Preterm AB Living  6 3 3  0 1 3  SAB IAB Ectopic Multiple Live Births  1 0 0 0 3    # Outcome Date GA Lbr Len/2nd Weight Sex Delivery Anes PTL Lv  6 Current           5 Term 11/22/16 [redacted]w[redacted]d 04:30 / 00:11 2860 g F Vag-Spont EPI  LIV     Name: MORALES HERNANDEZ,PENDINGBABY     Apgar1: 8  Apgar5: 9  4 SAB 10/12/16    U SAB None    3 Term 07/27/14     Vag-Spont   LIV  2 Term 11/12/09     Vag-Spont  N LIV  1 Gravida             PROBLEM LIST  Pregnancy complications or risks: Patient Active Problem List   Diagnosis Date Noted   Indication for care in labor and delivery, antepartum 01/09/2018   Indication for care or intervention in labor or delivery 01/09/2018   Labor and delivery, indication for care 11/22/2016    Prenatal labs and studies: ABO, Rh: --/--/O POS Performed at [facility], [address] [place], [place]  [place]  [phone]) Antibody:  pending Rubella:  pending RPR:   pending HBsAg:   pending HIV:   pending GBS: pending   Past Medical History:  Diagnosis Date   Medical history non-contributory      Past Surgical History:  Procedure Laterality Date   NO PAST SURGERIES       Medications    Current Discharge Medication List     CONTINUE these medications which have NOT CHANGED   Details  acetaminophen (TYLENOL) 325 MG tablet Take 2 tablets (650 mg total) by mouth every 4 (four) hours as needed (for pain scale < 4).    amoxicillin-clavulanate (AUGMENTIN) 875-125 MG tablet Take 1 tablet by mouth 2 (two) times daily. Qty: 14 tablet, Refills: 0    ibuprofen (ADVIL,MOTRIN) 600 MG tablet Take 1 tablet (600 mg total) by mouth every 6 (six) hours. Qty: 60 tablet, Refills: 0    magic mouthwash SOLN 24mL Anbesol 76mL Benadryl 15mL Mylanta  81mL swish, gargle & spit q8hr prn throat discomfort Qty: 72 mL, Refills: 0    Prenatal Vit-Fe Fumarate-FA (MULTIVITAMIN-PRENATAL) 27-0.8 MG TABS tablet Take 1 tablet by mouth daily at 12 noon.    senna-docusate (SENOKOT-S) 8.6-50  MG tablet Take 2 tablets by mouth at bedtime.         Allergies  Patient has no known allergies.  Review of Systems  Constitutional: negative for anorexia, chills, fevers, and night sweats Respiratory: negative for asthma, cough, and dyspnea on exertion Cardiovascular: negative for chest pain and irregular heart beat Gastrointestinal: positive for abdominal pain, negative for constipation, diarrhea, nausea, and vomiting Genitourinary:negative for dysuria, frequency, and hematuria Behavioral/Psych: negative  Physical Exam  BP 120/71   Pulse 69   Temp 98.1 F (36.7 C) (Oral)   Resp 18   Ht 5\' 3"  (1.6 m)   Wt 72.6 kg   LMP  (LMP Unknown)   SpO2 99%   BMI 28.34 kg/m   Lungs:  CTAB Cardio: RRR without M/R/G Abd: Soft, gravid, NT. Ctx palpate moderate/strong Presentation: cephalic Speculum  exam: grossly ruptured for clear fluid CERVIX: Dilation: 3 Effacement (%): 40 Cervical Position: Posterior Station: Ballotable Presentation: Vertex Exam by:: [email or web address], CNM   See Prenatal records for more detailed PE.     FHR:  Baseline: 120 Variability: moderate Accelerations: present Decelerations: none Toco: regular, every 2-6 minutes, moderate/strong to palpation Category 1  Test Results  Results for orders placed or performed during the hospital encounter of 04/11/22 (from the past 24 hour(s))  CBC     Status: Abnormal   Collection Time: 04/10/22 11:42 PM  Result Value Ref Range   WBC 6.7 4.0 - 10.5 K/uL   RBC 3.90 3.87 - 5.11 MIL/uL   Hemoglobin 11.3 (L) 12.0 - 15.0 g/dL   HCT 14/12/23 (L) 60.0 - 45.9 %   MCV 87.4 80.0 - 100.0 fL   MCH 29.0 26.0 - 34.0 pg   MCHC 33.1 30.0 - 36.0 g/dL   RDW 97.7 41.4 - 23.9 %   Platelets 174 150 - 400 K/uL   nRBC 0.0 0.0 - 0.2 %  hCG, quantitative, pregnancy     Status: Abnormal   Collection Time: 04/10/22 11:42 PM  Result Value Ref Range   hCG, Beta Chain, Quant, S 6,451 (H) <5 mIU/mL  ABO/Rh     Status: None   Collection Time: 04/10/22 11:42 PM  Result Value Ref Range   ABO/RH(D)      O POS Performed at [facility], [address] [place], [place]   Urinalysis, Routine w reflex microscopic Urine, Clean Catch     Status: Abnormal   Collection Time: 04/10/22 11:45 PM  Result Value Ref Range   Color, Urine YELLOW (A) YELLOW   APPearance CLOUDY (A) CLEAR   Specific Gravity, Urine 1.012 1.005 - 1.030   pH 6.0 5.0 - 8.0   Glucose, UA NEGATIVE NEGATIVE mg/dL   Hgb urine dipstick LARGE (A) NEGATIVE   Bilirubin Urine NEGATIVE NEGATIVE   Ketones, ur NEGATIVE NEGATIVE mg/dL   Protein, ur 30 (A) NEGATIVE mg/dL   Nitrite NEGATIVE NEGATIVE   Leukocytes,Ua MODERATE (A) NEGATIVE   RBC / HPF 21-50 0 - 5 RBC/hpf   WBC, UA 21-50 0 - 5 WBC/hpf   Bacteria, UA MANY (A) NONE SEEN   Squamous Epithelial / LPF 11-20 0 -  5   Mucus PRESENT    Amorphous Crystal PRESENT    Other: prenatal labs and GBS pending  Assessment  14/12/23 at Unknown Estimated Date of Delivery Reassuring maternal/fetal status. PPROM Preterm labor  Patient Active Problem List   Diagnosis Date Noted   Indication for care in labor and delivery, antepartum 01/09/2018  Indication for care or intervention in labor or delivery 01/09/2018   Labor and delivery, indication for care 11/22/2016    Plan  1. Admit to L&D 2. EFM: Continuous -- Category 1 3. Discussed plan of care with Dr. Valentino Saxon. Will administer betamethasone now for fetal lung maturity and nifedipine for tocolysis. Consult with NICU once gestational age is established. 4. Prenatal labs and GBS ordered.  Guadlupe Spanish, CNM 04/11/2022 1:50 AM

## 2022-04-11 NOTE — Anesthesia Preprocedure Evaluation (Signed)
Anesthesia Evaluation  Patient identified by MRN, date of birth, ID band Patient awake    Reviewed: Allergy & Precautions, H&P , NPO status , Patient's Chart, lab work & pertinent test results  Airway Mallampati: II  TM Distance: >3 FB     Dental no notable dental hx. (+) Teeth Intact   Pulmonary    Pulmonary exam normal        Cardiovascular negative cardio ROS Normal cardiovascular exam Rhythm:regular     Neuro/Psych negative neurological ROS  negative psych ROS   GI/Hepatic negative GI ROS, Neg liver ROS,,,  Endo/Other  negative endocrine ROS    Renal/GU negative Renal ROS  negative genitourinary   Musculoskeletal   Abdominal   Peds  Hematology negative hematology ROS (+)   Anesthesia Other Findings   Reproductive/Obstetrics (+) Pregnancy                             Anesthesia Physical Anesthesia Plan  ASA: 2  Anesthesia Plan: Epidural   Post-op Pain Management:    Induction:   PONV Risk Score and Plan:   Airway Management Planned:   Additional Equipment:   Intra-op Plan:   Post-operative Plan:   Informed Consent: I have reviewed the patients History and Physical, chart, labs and discussed the procedure including the risks, benefits and alternatives for the proposed anesthesia with the patient or authorized representative who has indicated his/her understanding and acceptance.       Plan Discussed with: Anesthesiologist and CRNA  Anesthesia Plan Comments:        Anesthesia Quick Evaluation

## 2022-04-12 LAB — CBC
HCT: 29.6 % — ABNORMAL LOW (ref 36.0–46.0)
Hemoglobin: 9.7 g/dL — ABNORMAL LOW (ref 12.0–15.0)
MCH: 28.5 pg (ref 26.0–34.0)
MCHC: 32.8 g/dL (ref 30.0–36.0)
MCV: 87.1 fL (ref 80.0–100.0)
Platelets: 174 10*3/uL (ref 150–400)
RBC: 3.4 MIL/uL — ABNORMAL LOW (ref 3.87–5.11)
RDW: 13.4 % (ref 11.5–15.5)
WBC: 13 10*3/uL — ABNORMAL HIGH (ref 4.0–10.5)
nRBC: 0 % (ref 0.0–0.2)

## 2022-04-12 LAB — VARICELLA ZOSTER ANTIBODY, IGG: Varicella IgG: <135 index — ABNORMAL LOW (ref >165)

## 2022-04-12 LAB — URINE CULTURE

## 2022-04-12 LAB — RUBELLA SCREEN: Rubella: 1.12 index (ref >0.99)

## 2022-04-12 NOTE — Clinical Social Work Maternal (Signed)
  CLINICAL SOCIAL WORK MATERNAL/CHILD NOTE  Patient Details  Name: Carrie Wagner MRN: [id number] Date of Birth: 01-Sep-1990  Date:  04/12/2022  Clinical Social Worker Initiating Note:  Doran Clay RN BSN Case Manager Date/Time: Initiated:  04/12/22/1446     Child's Name:  Unice Cobble   Biological Parents:  Mother, Father   Need for Interpreter:  None   Reason for Referral:  Late or No Prenatal Care     Address:  [address] [place] [address]    Phone number:  [phone] (home)     Additional phone number: NA  Household Members/Support Persons (HM/SP):   Household Member/Support Person 1, Household Member/Support Person 2, Household Member/Support Person 3, Household Member/Support Person 4   HM/SP Name Relationship DOB or Age  HM/SP -80 [place] spouse 33  HM/SP -2 Alex [place] son 8  HM/SP -[employer] daughter 5  HM/SP -[place] daughter 4  HM/SP -5        HM/SP -6        HM/SP -7        HM/SP -8          Natural Supports (not living in the home):  Parent, Immediate Family   Professional Supports:     Employment: Full-time   Type of Work: Magazine features editor   Education:  9 to 11 years   Homebound arranged:    Museum/gallery curator Resources:      Other Resources:  [facility]   Cultural/Religious Considerations Which May Impact Care:  NA  Strengths:  Ability to meet basic needs  , Pediatrician chosen   Psychotropic Medications:         Pediatrician:    [facility]  Pediatrician List:   [facility] Other ([facility])  [facility]      Pediatrician Fax Number:    Risk Factors/Current Problems:  None   Cognitive State:  Alert     Mood/Affect:  Interested  , Calm     CSW Assessment: TOC consult for no prenatal care.  RNCM met with mother of newborn at the bedside, newborn in bassinet sleeping.  Introduced self and reason for  consult.  Mother of baby reports that she did not know she was pregnant until she came into the emergency department as she does not have a period and this pregnancy was not like her previous 3 and her belly did not get very big.  She goes to [facility] and so do her children so she will schedule pediatrician appointment for this baby at [facility].  Her spouse bought a car seat and will be picking them up this evening after he gets off work.  There are some things she still needs to get like a bassinet but if unable to get this tonight RNCM suggested a dresser drawer for the baby to sleep in.  They have reliable transportation and she has no questions or concerns.  She has support from her sister and her mother in addition to her spouse.   CSW Plan/Description:  No Further Intervention Required/No Barriers to Discharge    Shelbie Hutching, RN 04/12/2022, 2:49 PM

## 2022-04-12 NOTE — Progress Notes (Signed)
Discharge instructions and follow up appointments discussed and reviewed with patient. Understanding verbalized. Patient discharged home with all belongings. Patient wheeled down by this RN with significant other at side carrying infant in car seat.

## 2022-04-12 NOTE — Discharge Instructions (Signed)

## 2022-04-16 NOTE — Anesthesia Postprocedure Evaluation (Signed)
Anesthesia Post Note  Patient: Carrie Wagner  Procedure(s) Performed: AN AD HOC LABOR EPIDURAL  Patient location during evaluation: Mother Baby Anesthesia Type: Epidural Level of consciousness: awake and alert and oriented Pain management: pain level controlled Vital Signs Assessment: post-procedure vital signs reviewed and stable Respiratory status: spontaneous breathing, nonlabored ventilation and respiratory function stable Cardiovascular status: blood pressure returned to baseline Postop Assessment: no headache, no backache, able to ambulate and no apparent nausea or vomiting Anesthetic complications: no   No notable events documented.   Last Vitals:  Vitals:   04/12/22 1230 04/12/22 1910  BP: 97/72 102/68  Pulse: 71 80  Resp: 17 18  Temp: 36.9 C 37 C  SpO2: 99% 97%    Last Pain:  Vitals:   04/12/22 1910  TempSrc: Oral  PainSc: 0-No pain                 Ariez Neilan D Deziah Renwick

## 2022-05-28 ENCOUNTER — Telehealth: Payer: Self-pay

## 2022-05-28 NOTE — Telephone Encounter (Signed)
[facility]- Discharge Call Backs-Pt did not answer the phone.  Let a VM about the following information below. 1-Do you have any questions or concerns about yourself as you heal? 2-Any concerns or questions about your baby? 3-Reviewed ABC's of safe sleep. 4-How was your stay at the hospital? 5-How did our team work together to care for you? You should be receiving a survey in the mail soon.   We would really appreciate it if you could fill that out for [place] and return it in the mail.  We value the feedback to make improvements and continue the great work we do.   If you have any questions please feel free to call me back at [phone]
